# Patient Record
Sex: Female | Born: 1940 | Race: Black or African American | Hispanic: No | Smoking: Never smoker
Health system: Southern US, Community
[De-identification: ages and names within clinical notes are randomized; demographics above are authoritative.]

## PROBLEM LIST (undated history)

## (undated) DIAGNOSIS — Z8639 Personal history of other endocrine, nutritional and metabolic disease: Secondary | ICD-10-CM

## (undated) DIAGNOSIS — N1330 Unspecified hydronephrosis: Secondary | ICD-10-CM

## (undated) DIAGNOSIS — R350 Frequency of micturition: Secondary | ICD-10-CM

## (undated) DIAGNOSIS — Z923 Personal history of irradiation: Secondary | ICD-10-CM

## (undated) DIAGNOSIS — H269 Unspecified cataract: Secondary | ICD-10-CM

## (undated) DIAGNOSIS — Z860100 Personal history of colon polyps, unspecified: Secondary | ICD-10-CM

## (undated) DIAGNOSIS — Z87440 Personal history of urinary (tract) infections: Secondary | ICD-10-CM

## (undated) DIAGNOSIS — L309 Dermatitis, unspecified: Secondary | ICD-10-CM

## (undated) DIAGNOSIS — Z972 Presence of dental prosthetic device (complete) (partial): Secondary | ICD-10-CM

## (undated) HISTORY — PX: TUBAL LIGATION: SHX77

## (undated) HISTORY — PX: DILATION AND CURETTAGE OF UTERUS: SHX78

---
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
## 2000-04-13 ENCOUNTER — Other Ambulatory Visit: Admission: RE | Admit: 2000-04-13 | Discharge: 2000-04-13 | Payer: Self-pay | Admitting: Obstetrics and Gynecology

## 2000-04-27 ENCOUNTER — Encounter: Payer: Self-pay | Admitting: Obstetrics and Gynecology

## 2000-04-27 ENCOUNTER — Encounter: Admission: RE | Admit: 2000-04-27 | Discharge: 2000-04-27 | Payer: Self-pay | Admitting: Obstetrics and Gynecology

## 2001-04-12 ENCOUNTER — Encounter: Payer: Self-pay | Admitting: Rheumatology

## 2001-04-12 ENCOUNTER — Encounter: Admission: RE | Admit: 2001-04-12 | Discharge: 2001-04-12 | Payer: Self-pay | Admitting: Rheumatology

## 2001-05-03 ENCOUNTER — Encounter: Admission: RE | Admit: 2001-05-03 | Discharge: 2001-05-03 | Payer: Self-pay | Admitting: Internal Medicine

## 2001-05-03 ENCOUNTER — Encounter: Payer: Self-pay | Admitting: Internal Medicine

## 2002-05-09 ENCOUNTER — Encounter: Payer: Self-pay | Admitting: Internal Medicine

## 2002-05-09 ENCOUNTER — Encounter: Admission: RE | Admit: 2002-05-09 | Discharge: 2002-05-09 | Payer: Self-pay | Admitting: Internal Medicine

## 2002-08-29 ENCOUNTER — Ambulatory Visit (HOSPITAL_COMMUNITY): Admission: RE | Admit: 2002-08-29 | Discharge: 2002-08-29 | Payer: Self-pay | Admitting: Gastroenterology

## 2003-07-03 ENCOUNTER — Other Ambulatory Visit: Admission: RE | Admit: 2003-07-03 | Discharge: 2003-07-03 | Payer: Self-pay | Admitting: Internal Medicine

## 2003-07-24 ENCOUNTER — Encounter: Admission: RE | Admit: 2003-07-24 | Discharge: 2003-07-24 | Payer: Self-pay | Admitting: Internal Medicine

## 2004-07-29 ENCOUNTER — Encounter: Admission: RE | Admit: 2004-07-29 | Discharge: 2004-07-29 | Payer: Self-pay | Admitting: Internal Medicine

## 2004-09-23 ENCOUNTER — Other Ambulatory Visit: Admission: RE | Admit: 2004-09-23 | Discharge: 2004-09-23 | Payer: Self-pay | Admitting: Internal Medicine

## 2005-08-04 ENCOUNTER — Encounter: Admission: RE | Admit: 2005-08-04 | Discharge: 2005-08-04 | Payer: Self-pay | Admitting: Internal Medicine

## 2005-10-13 ENCOUNTER — Other Ambulatory Visit: Admission: RE | Admit: 2005-10-13 | Discharge: 2005-10-13 | Payer: Self-pay | Admitting: Internal Medicine

## 2006-08-10 ENCOUNTER — Encounter: Admission: RE | Admit: 2006-08-10 | Discharge: 2006-08-10 | Payer: Self-pay | Admitting: Internal Medicine

## 2006-08-24 ENCOUNTER — Encounter: Admission: RE | Admit: 2006-08-24 | Discharge: 2006-08-24 | Payer: Self-pay | Admitting: Internal Medicine

## 2006-11-16 ENCOUNTER — Other Ambulatory Visit: Admission: RE | Admit: 2006-11-16 | Discharge: 2006-11-16 | Payer: Self-pay | Admitting: Internal Medicine

## 2007-08-16 ENCOUNTER — Encounter: Admission: RE | Admit: 2007-08-16 | Discharge: 2007-08-16 | Payer: Self-pay | Admitting: Internal Medicine

## 2009-07-30 ENCOUNTER — Other Ambulatory Visit: Admission: RE | Admit: 2009-07-30 | Discharge: 2009-07-30 | Payer: Self-pay | Admitting: Geriatric Medicine

## 2009-08-20 ENCOUNTER — Encounter: Admission: RE | Admit: 2009-08-20 | Discharge: 2009-08-20 | Payer: Self-pay | Admitting: Internal Medicine

## 2010-01-27 ENCOUNTER — Encounter: Payer: Self-pay | Admitting: Internal Medicine

## 2010-05-24 NOTE — Op Note (Signed)
   NAME:  MATRACA, HUNKINS NO.:  [id number]   MEDICAL RECORD NO.:  [id number]                   PATIENT TYPE:  AMB   LOCATION:  ENDO                                 FACILITY:  [facility]   PHYSICIAN:  Danise Edge, M.D.                DATE OF BIRTH:  Oct 14, 1940   DATE OF PROCEDURE:  08/29/2002  DATE OF DISCHARGE:                                 OPERATIVE REPORT   PROCEDURE:  Screening colonoscopy.   INDICATIONS FOR PROCEDURE:  Ms. Xandrea Clarey is a 70 year old female born  06/06/1940. Ms. Celia is scheduled to undergo her first screening  colonoscopy with polypectomy to prevent colon cancer.   ENDOSCOPIST:  Charolett Bumpers, M.D.   PREMEDICATION:  Versed 7.5 mg, Demerol 50 mg .   DESCRIPTION OF PROCEDURE:  After obtaining informed consent, Ms. Shoaff was  placed in the left lateral decubitus position. I administered intravenous  Demerol and intravenous Versed to achieve conscious sedation for the  procedure. The patient's blood pressure, oxygen saturation and cardiac  rhythm were monitored throughout the procedure and documented in the medical  record.   Anal inspection was normal. Digital rectal exam was normal. The Olympus  pediatric adjustable colonoscope was introduced into the rectum and advanced  to the cecum. Colonic preparation for the exam today was excellent.   RECTUM:  Normal.   SIGMOID COLON AND DESCENDING COLON:  Normal.   SPLENIC FLEXURE:  Normal.   TRANSVERSE COLON:  Normal.   HEPATIC FLEXURE:  Normal.   ASCENDING COLON:  Normal.   CECUM AND ILEOCECAL VALVE:  Normal.    ASSESSMENT:  Normal screening proctocolonoscopy to the cecum. No endoscopic  evidence for the presence of colorectal neoplasia.                                               Danise Edge, M.D.    MJ/MEDQ  D:  08/29/2002  T:  08/29/2002  Job:  [id number]   cc:   Lilla Shook, M.D.  [address], [address]  [place]  [place]  [address]  Fax: [phone]

## 2010-07-31 ENCOUNTER — Other Ambulatory Visit: Payer: Self-pay | Admitting: Internal Medicine

## 2010-07-31 DIAGNOSIS — Z1231 Encounter for screening mammogram for malignant neoplasm of breast: Secondary | ICD-10-CM

## 2010-08-05 ENCOUNTER — Other Ambulatory Visit (HOSPITAL_COMMUNITY)
Admission: RE | Admit: 2010-08-05 | Discharge: 2010-08-05 | Disposition: A | Discharge status: Home / Self Care | Payer: Medicare Other | Source: Ambulatory Visit | Attending: Internal Medicine | Admitting: Internal Medicine

## 2010-08-05 ENCOUNTER — Other Ambulatory Visit: Payer: Self-pay | Admitting: *Deleted

## 2010-08-05 DIAGNOSIS — Z01419 Encounter for gynecological examination (general) (routine) without abnormal findings: Secondary | ICD-10-CM | POA: Insufficient documentation

## 2010-08-26 ENCOUNTER — Ambulatory Visit
Admission: RE | Admit: 2010-08-26 | Discharge: 2010-08-26 | Disposition: A | Discharge status: Home / Self Care | Payer: Medicare Other | Source: Ambulatory Visit | Attending: Internal Medicine | Admitting: Internal Medicine

## 2010-08-26 DIAGNOSIS — Z1231 Encounter for screening mammogram for malignant neoplasm of breast: Secondary | ICD-10-CM

## 2010-09-18 ENCOUNTER — Other Ambulatory Visit (HOSPITAL_COMMUNITY): Payer: Self-pay | Admitting: Internal Medicine

## 2010-09-18 DIAGNOSIS — E0590 Thyrotoxicosis, unspecified without thyrotoxic crisis or storm: Secondary | ICD-10-CM

## 2010-10-03 ENCOUNTER — Ambulatory Visit (HOSPITAL_COMMUNITY): Payer: Medicare Other

## 2010-10-03 ENCOUNTER — Encounter (HOSPITAL_COMMUNITY)
Admission: RE | Admit: 2010-10-03 | Discharge: 2010-10-03 | Disposition: A | Discharge status: Home / Self Care | Payer: Medicare Other | Source: Ambulatory Visit | Attending: Internal Medicine | Admitting: Internal Medicine

## 2010-10-03 DIAGNOSIS — E0590 Thyrotoxicosis, unspecified without thyrotoxic crisis or storm: Secondary | ICD-10-CM | POA: Insufficient documentation

## 2010-10-04 ENCOUNTER — Other Ambulatory Visit (HOSPITAL_COMMUNITY): Payer: Medicare Other

## 2010-10-04 ENCOUNTER — Encounter (HOSPITAL_COMMUNITY)
Admission: RE | Admit: 2010-10-04 | Discharge: 2010-10-04 | Disposition: A | Discharge status: Home / Self Care | Payer: Medicare Other | Source: Ambulatory Visit | Attending: Internal Medicine | Admitting: Internal Medicine

## 2010-10-04 DIAGNOSIS — E042 Nontoxic multinodular goiter: Secondary | ICD-10-CM | POA: Insufficient documentation

## 2010-10-04 DIAGNOSIS — E0590 Thyrotoxicosis, unspecified without thyrotoxic crisis or storm: Secondary | ICD-10-CM | POA: Insufficient documentation

## 2010-10-04 MED ORDER — SODIUM IODIDE I 131 CAPSULE
8.1000 | Freq: Once | INTRAVENOUS | Status: AC | PRN
  Administered 2010-10-03: 8.1 via ORAL

## 2010-10-04 MED ORDER — SODIUM PERTECHNETATE TC 99M INJECTION
10.2000 | Freq: Once | INTRAVENOUS | Status: AC | PRN
  Administered 2010-10-04: 10 via INTRAVENOUS

## 2010-10-15 ENCOUNTER — Ambulatory Visit (HOSPITAL_COMMUNITY): Payer: Medicare Other

## 2010-10-16 ENCOUNTER — Other Ambulatory Visit (HOSPITAL_COMMUNITY): Payer: Medicare Other

## 2010-11-01 ENCOUNTER — Other Ambulatory Visit: Payer: Self-pay | Admitting: Endocrinology

## 2010-11-01 DIAGNOSIS — E0590 Thyrotoxicosis, unspecified without thyrotoxic crisis or storm: Secondary | ICD-10-CM

## 2010-11-11 ENCOUNTER — Ambulatory Visit
Admission: RE | Admit: 2010-11-11 | Discharge: 2010-11-11 | Disposition: A | Discharge status: Home / Self Care | Payer: Medicare Other | Source: Ambulatory Visit | Attending: Endocrinology | Admitting: Endocrinology

## 2010-11-11 DIAGNOSIS — E0590 Thyrotoxicosis, unspecified without thyrotoxic crisis or storm: Secondary | ICD-10-CM

## 2010-11-26 ENCOUNTER — Other Ambulatory Visit (HOSPITAL_COMMUNITY): Payer: Self-pay | Admitting: Endocrinology

## 2010-11-26 DIAGNOSIS — E041 Nontoxic single thyroid nodule: Secondary | ICD-10-CM

## 2010-12-11 ENCOUNTER — Other Ambulatory Visit: Payer: Self-pay | Admitting: Endocrinology

## 2010-12-11 DIAGNOSIS — E042 Nontoxic multinodular goiter: Secondary | ICD-10-CM

## 2010-12-17 ENCOUNTER — Ambulatory Visit
Admission: RE | Admit: 2010-12-17 | Discharge: 2010-12-17 | Disposition: A | Discharge status: Home / Self Care | Payer: Medicare Other | Source: Ambulatory Visit | Attending: Endocrinology | Admitting: Endocrinology

## 2010-12-17 ENCOUNTER — Other Ambulatory Visit (HOSPITAL_COMMUNITY)
Admission: RE | Admit: 2010-12-17 | Discharge: 2010-12-17 | Disposition: A | Discharge status: Home / Self Care | Payer: Medicare Other | Source: Ambulatory Visit | Attending: Interventional Radiology | Admitting: Interventional Radiology

## 2010-12-17 DIAGNOSIS — E042 Nontoxic multinodular goiter: Secondary | ICD-10-CM

## 2010-12-17 DIAGNOSIS — E049 Nontoxic goiter, unspecified: Secondary | ICD-10-CM | POA: Insufficient documentation

## 2011-01-09 ENCOUNTER — Other Ambulatory Visit: Payer: Self-pay | Admitting: Endocrinology

## 2011-01-09 DIAGNOSIS — E042 Nontoxic multinodular goiter: Secondary | ICD-10-CM

## 2011-01-09 DIAGNOSIS — E0590 Thyrotoxicosis, unspecified without thyrotoxic crisis or storm: Secondary | ICD-10-CM

## 2011-01-27 ENCOUNTER — Encounter (HOSPITAL_COMMUNITY)
Admission: RE | Admit: 2011-01-27 | Discharge: 2011-01-27 | Disposition: A | Discharge status: Home / Self Care | Payer: Medicare Other | Source: Ambulatory Visit | Attending: Endocrinology | Admitting: Endocrinology

## 2011-01-27 DIAGNOSIS — E0590 Thyrotoxicosis, unspecified without thyrotoxic crisis or storm: Secondary | ICD-10-CM | POA: Insufficient documentation

## 2011-01-27 DIAGNOSIS — E042 Nontoxic multinodular goiter: Secondary | ICD-10-CM | POA: Insufficient documentation

## 2011-01-27 MED ORDER — SODIUM IODIDE I 131 CAPSULE
15.6600 | Freq: Once | INTRAVENOUS | Status: AC | PRN
  Administered 2011-01-27: 15.66 via ORAL

## 2011-05-12 ENCOUNTER — Other Ambulatory Visit: Payer: Self-pay | Admitting: Internal Medicine

## 2011-05-12 DIAGNOSIS — Z1231 Encounter for screening mammogram for malignant neoplasm of breast: Secondary | ICD-10-CM

## 2011-09-01 ENCOUNTER — Ambulatory Visit
Admission: RE | Admit: 2011-09-01 | Discharge: 2011-09-01 | Disposition: A | Discharge status: Home / Self Care | Payer: Medicare Other | Source: Ambulatory Visit | Attending: Internal Medicine | Admitting: Internal Medicine

## 2011-09-01 DIAGNOSIS — Z1231 Encounter for screening mammogram for malignant neoplasm of breast: Secondary | ICD-10-CM

## 2011-12-29 ENCOUNTER — Other Ambulatory Visit (HOSPITAL_COMMUNITY)
Admission: RE | Admit: 2011-12-29 | Discharge: 2011-12-29 | Disposition: A | Discharge status: Home / Self Care | Payer: Medicare Other | Source: Ambulatory Visit | Attending: Internal Medicine | Admitting: Internal Medicine

## 2011-12-29 ENCOUNTER — Other Ambulatory Visit: Payer: Self-pay | Admitting: Internal Medicine

## 2011-12-29 DIAGNOSIS — Z124 Encounter for screening for malignant neoplasm of cervix: Secondary | ICD-10-CM | POA: Insufficient documentation

## 2012-05-24 ENCOUNTER — Other Ambulatory Visit: Payer: Self-pay | Admitting: Gastroenterology

## 2012-06-24 ENCOUNTER — Encounter (HOSPITAL_COMMUNITY): Payer: Self-pay | Admitting: *Deleted

## 2012-07-20 ENCOUNTER — Encounter (HOSPITAL_COMMUNITY): Payer: Self-pay | Admitting: Pharmacy Technician

## 2012-07-27 ENCOUNTER — Other Ambulatory Visit: Payer: Self-pay

## 2012-07-27 DIAGNOSIS — Z1231 Encounter for screening mammogram for malignant neoplasm of breast: Secondary | ICD-10-CM

## 2012-08-10 ENCOUNTER — Ambulatory Visit (HOSPITAL_COMMUNITY): Payer: Medicare Other | Admitting: Anesthesiology

## 2012-08-10 ENCOUNTER — Encounter (HOSPITAL_COMMUNITY)
Admission: RE | Disposition: A | Discharge status: Home / Self Care | Payer: Self-pay | Source: Ambulatory Visit | Attending: Gastroenterology

## 2012-08-10 ENCOUNTER — Encounter (HOSPITAL_COMMUNITY): Payer: Self-pay | Admitting: Anesthesiology

## 2012-08-10 ENCOUNTER — Ambulatory Visit (HOSPITAL_COMMUNITY)
Admission: RE | Admit: 2012-08-10 | Discharge: 2012-08-10 | Disposition: A | Discharge status: Home / Self Care | Payer: Medicare Other | Source: Ambulatory Visit | Attending: Gastroenterology | Admitting: Gastroenterology

## 2012-08-10 DIAGNOSIS — Z1211 Encounter for screening for malignant neoplasm of colon: Secondary | ICD-10-CM | POA: Insufficient documentation

## 2012-08-10 DIAGNOSIS — K5730 Diverticulosis of large intestine without perforation or abscess without bleeding: Secondary | ICD-10-CM | POA: Insufficient documentation

## 2012-08-10 HISTORY — PX: COLONOSCOPY WITH PROPOFOL: SHX5780

## 2012-08-10 SURGERY — COLONOSCOPY WITH PROPOFOL
Anesthesia: Monitor Anesthesia Care

## 2012-08-10 MED ORDER — ONDANSETRON HCL 4 MG/2ML IJ SOLN
INTRAMUSCULAR | Status: DC | PRN
  Administered 2012-08-10 (×2): 2 mg via INTRAVENOUS

## 2012-08-10 MED ORDER — KETAMINE HCL 10 MG/ML IJ SOLN
INTRAMUSCULAR | Status: DC | PRN
  Administered 2012-08-10: 15 mg via INTRAVENOUS

## 2012-08-10 MED ORDER — SODIUM CHLORIDE 0.9 % IV SOLN: INTRAVENOUS | Status: DC

## 2012-08-10 MED ORDER — LACTATED RINGERS IV SOLN
INTRAVENOUS | Status: DC | PRN
  Administered 2012-08-10: 07:00:00 via INTRAVENOUS

## 2012-08-10 MED ORDER — MIDAZOLAM HCL 5 MG/5ML IJ SOLN
INTRAMUSCULAR | Status: DC | PRN
  Administered 2012-08-10: 0.5 mg via INTRAVENOUS

## 2012-08-10 MED ORDER — FENTANYL CITRATE 0.05 MG/ML IJ SOLN
INTRAMUSCULAR | Status: DC | PRN
  Administered 2012-08-10: 50 ug via INTRAVENOUS

## 2012-08-10 MED ORDER — PROPOFOL INFUSION 10 MG/ML OPTIME
INTRAVENOUS | Status: DC | PRN
  Administered 2012-08-10: 100 ug/kg/min via INTRAVENOUS

## 2012-08-10 MED ORDER — LACTATED RINGERS IV SOLN
INTRAVENOUS | Status: DC
  Administered 2012-08-10: 1000 mL via INTRAVENOUS

## 2012-08-10 SURGICAL SUPPLY — 21 items

## 2012-08-10 NOTE — Transfer of Care (Signed)
Immediate Anesthesia Transfer of Care Note  Patient: Sandra [name]  Procedure(s) Performed: Procedure(s): COLONOSCOPY WITH PROPOFOL (N/A)  Patient Location: PACU  Anesthesia Type:MAC  Level of Consciousness: awake, alert , oriented, sedated and patient cooperative  Airway & Oxygen Therapy: Patient Spontanous Breathing and Patient connected to nasal cannula oxygen  Post-op Assessment: Report given to PACU RN and Post -op Vital signs reviewed and stable  Post vital signs: stable  Complications: No apparent anesthesia complications

## 2012-08-10 NOTE — Op Note (Signed)
Procedure: Screening colonoscopy  Endoscopist: Laural Benes  Premedication: Propofol administered by anesthesia  Procedure: The patient was placed in the left lateral decubitus position. Anal inspection and digital rectal exam were normal. The Pentax pediatric colonoscope was introduced into the rectum and advanced to the cecum. A normal-appearing appendiceal orifice and ileocecal valve were identified. Colonic preparation for the exam today was good.  Rectum. Normal. Retroflexed view of the distal rectum was normal.  Sigmoid colon and descending colon. Colonic diverticulosis.  Splenic flexure. Normal.  Transverse colon. Normal.  Hepatic flexure. Normal.  Ascending colon. Normal.  Cecum and ileocecal valve. Normal.  Assessment: Normal screening proctocolonoscopy to the cecum.

## 2012-08-10 NOTE — H&P (Signed)
  Procedure: Repeat screening colonoscopy  History: The patient is a 72 year old female born 05-12-1940. The patient underwent a screening colonoscopy on 08/29/2002. She is scheduled to undergo a repeat screening colonoscopy today.  Past medical history: Multinodular goiter associated with hyperthyroidism. Radioactive iodine ablation of the multinodular goiter. Osteopenia. Tubal ligation. Hemorrhoidectomy. Cesarean section.  Habits: The patient has never smoked cigarettes and she does not consume alcohol   Exam: The patient is alert and lying comfortably on the endoscopy stretcher. Abdomen is soft and nontender to palpation. Cardiac exam reveals a regular rhythm. Lungs are clear to auscultation.  Plan: Proceed with screening colonoscopy

## 2012-08-10 NOTE — Anesthesia Preprocedure Evaluation (Signed)
Anesthesia Evaluation  Patient identified by MRN, date of birth, ID band Patient awake    Reviewed: Allergy & Precautions, H&P , NPO status , Patient's Chart, lab work & pertinent test results  Airway Mallampati: II TM Distance: >3 FB Neck ROM: Full    Dental no notable dental hx.    Pulmonary neg pulmonary ROS,  breath sounds clear to auscultation  Pulmonary exam normal       Cardiovascular negative cardio ROS  Rhythm:Regular Rate:Normal     Neuro/Psych negative neurological ROS  negative psych ROS   GI/Hepatic negative GI ROS, Neg liver ROS,   Endo/Other  negative endocrine ROS  Renal/GU negative Renal ROS  negative genitourinary   Musculoskeletal negative musculoskeletal ROS (+)   Abdominal   Peds negative pediatric ROS (+)  Hematology negative hematology ROS (+)   Anesthesia Other Findings   Reproductive/Obstetrics negative OB ROS                           Anesthesia Physical Anesthesia Plan  ASA: I  Anesthesia Plan: MAC   Post-op Pain Management:    Induction: Intravenous  Airway Management Planned:   Additional Equipment:   Intra-op Plan:   Post-operative Plan:   Informed Consent: I have reviewed the patients History and Physical, chart, labs and discussed the procedure including the risks, benefits and alternatives for the proposed anesthesia with the patient or authorized representative who has indicated his/her understanding and acceptance.   Dental advisory given  Plan Discussed with: CRNA  Anesthesia Plan Comments:         Anesthesia Quick Evaluation  

## 2012-08-10 NOTE — Anesthesia Postprocedure Evaluation (Signed)
  Anesthesia Post-op Note  Patient: Sandra [name]  Procedure(s) Performed: Procedure(s) (LRB): COLONOSCOPY WITH PROPOFOL (N/A)  Patient Location: PACU  Anesthesia Type: MAC  Level of Consciousness: awake and alert   Airway and Oxygen Therapy: Patient Spontanous Breathing  Post-op Pain: mild  Post-op Assessment: Post-op Vital signs reviewed, Patient's Cardiovascular Status Stable, Respiratory Function Stable, Patent Airway and No signs of Nausea or vomiting  Last Vitals:  Filed Vitals:   08/10/12 0900  BP: 151/87  Temp:   Resp: 21    Post-op Vital Signs: stable   Complications: No apparent anesthesia complications

## 2012-08-11 ENCOUNTER — Encounter (HOSPITAL_COMMUNITY): Payer: Self-pay | Admitting: Gastroenterology

## 2012-09-13 ENCOUNTER — Ambulatory Visit
Admission: RE | Admit: 2012-09-13 | Discharge: 2012-09-13 | Disposition: A | Discharge status: Home / Self Care | Payer: Medicare Other | Source: Ambulatory Visit

## 2012-09-13 DIAGNOSIS — Z1231 Encounter for screening mammogram for malignant neoplasm of breast: Secondary | ICD-10-CM

## 2013-08-08 ENCOUNTER — Other Ambulatory Visit: Payer: Self-pay

## 2013-08-08 DIAGNOSIS — Z1231 Encounter for screening mammogram for malignant neoplasm of breast: Secondary | ICD-10-CM

## 2013-09-19 ENCOUNTER — Ambulatory Visit
Admission: RE | Admit: 2013-09-19 | Discharge: 2013-09-19 | Disposition: A | Discharge status: Home / Self Care | Payer: Medicare Other | Source: Ambulatory Visit

## 2013-09-19 DIAGNOSIS — Z1231 Encounter for screening mammogram for malignant neoplasm of breast: Secondary | ICD-10-CM

## 2014-08-22 ENCOUNTER — Other Ambulatory Visit: Payer: Self-pay

## 2014-08-22 DIAGNOSIS — Z1231 Encounter for screening mammogram for malignant neoplasm of breast: Secondary | ICD-10-CM

## 2014-10-02 ENCOUNTER — Ambulatory Visit
Admission: RE | Admit: 2014-10-02 | Discharge: 2014-10-02 | Disposition: A | Discharge status: Home / Self Care | Payer: Medicare Other | Source: Ambulatory Visit

## 2014-10-02 DIAGNOSIS — Z1231 Encounter for screening mammogram for malignant neoplasm of breast: Secondary | ICD-10-CM

## 2015-02-06 ENCOUNTER — Other Ambulatory Visit: Payer: Self-pay | Admitting: Urology

## 2015-02-09 ENCOUNTER — Encounter (HOSPITAL_BASED_OUTPATIENT_CLINIC_OR_DEPARTMENT_OTHER): Payer: Self-pay | Admitting: *Deleted

## 2015-02-09 NOTE — Progress Notes (Signed)
NPO AFTER MN WITH EXCEPTION CLEAR LIQUIDS UNTIL 0830 (NO CREAM/ MILK PRODUCTS).  ARRIVE AT 1300. NEEDS HG. 

## 2015-02-19 ENCOUNTER — Ambulatory Visit (HOSPITAL_BASED_OUTPATIENT_CLINIC_OR_DEPARTMENT_OTHER): Payer: Medicare Other | Admitting: Anesthesiology

## 2015-02-19 ENCOUNTER — Encounter (HOSPITAL_BASED_OUTPATIENT_CLINIC_OR_DEPARTMENT_OTHER)
Admission: RE | Disposition: A | Discharge status: Home / Self Care | Payer: Self-pay | Source: Ambulatory Visit | Attending: Urology

## 2015-02-19 ENCOUNTER — Encounter (HOSPITAL_BASED_OUTPATIENT_CLINIC_OR_DEPARTMENT_OTHER): Payer: Self-pay | Admitting: Anesthesiology

## 2015-02-19 ENCOUNTER — Ambulatory Visit (HOSPITAL_BASED_OUTPATIENT_CLINIC_OR_DEPARTMENT_OTHER)
Admission: RE | Admit: 2015-02-19 | Discharge: 2015-02-19 | Disposition: A | Discharge status: Home / Self Care | Payer: Medicare Other | Source: Ambulatory Visit | Attending: Urology | Admitting: Urology

## 2015-02-19 DIAGNOSIS — Z79899 Other long term (current) drug therapy: Secondary | ICD-10-CM | POA: Insufficient documentation

## 2015-02-19 DIAGNOSIS — N1330 Unspecified hydronephrosis: Secondary | ICD-10-CM | POA: Diagnosis present

## 2015-02-19 HISTORY — DX: Unspecified hydronephrosis: N13.30

## 2015-02-19 HISTORY — PX: CYSTOSCOPY WITH RETROGRADE PYELOGRAM, URETEROSCOPY AND STENT PLACEMENT: SHX5789

## 2015-02-19 HISTORY — DX: Frequency of micturition: R35.0

## 2015-02-19 HISTORY — DX: Presence of dental prosthetic device (complete) (partial): Z97.2

## 2015-02-19 HISTORY — DX: Unspecified cataract: H26.9

## 2015-02-19 HISTORY — DX: Personal history of colonic polyps: Z86.010

## 2015-02-19 HISTORY — DX: Personal history of colon polyps, unspecified: Z86.0100

## 2015-02-19 HISTORY — DX: Personal history of other endocrine, nutritional and metabolic disease: Z86.39

## 2015-02-19 HISTORY — DX: Personal history of irradiation: Z92.3

## 2015-02-19 HISTORY — DX: Dermatitis, unspecified: L30.9

## 2015-02-19 LAB — POCT HEMOGLOBIN-HEMACUE: Hemoglobin: 11.9 g/dL — ABNORMAL LOW (ref 12.0–15.0)

## 2015-02-19 SURGERY — CYSTOURETEROSCOPY, WITH RETROGRADE PYELOGRAM AND STENT INSERTION
Anesthesia: General | Site: Ureter | Laterality: Right

## 2015-02-19 MED ORDER — LACTATED RINGERS IV SOLN
INTRAVENOUS | Status: DC
  Administered 2015-02-19 (×2): via INTRAVENOUS
  Filled 2015-02-19: qty 1000

## 2015-02-19 MED ORDER — FENTANYL CITRATE (PF) 100 MCG/2ML IJ SOLN
25.0000 ug | INTRAMUSCULAR | Status: DC | PRN
  Filled 2015-02-19: qty 1

## 2015-02-19 MED ORDER — DEXAMETHASONE SODIUM PHOSPHATE 10 MG/ML IJ SOLN
INTRAMUSCULAR | Status: DC | PRN
  Administered 2015-02-19: 10 mg via INTRAVENOUS

## 2015-02-19 MED ORDER — DEXAMETHASONE SODIUM PHOSPHATE 10 MG/ML IJ SOLN
INTRAMUSCULAR | Status: AC
  Filled 2015-02-19: qty 1

## 2015-02-19 MED ORDER — CEFAZOLIN SODIUM-DEXTROSE 2-3 GM-% IV SOLR
2.0000 g | INTRAVENOUS | Status: AC
  Administered 2015-02-19: 2 g via INTRAVENOUS
  Filled 2015-02-19: qty 50

## 2015-02-19 MED ORDER — PROPOFOL 10 MG/ML IV BOLUS
INTRAVENOUS | Status: DC | PRN
  Administered 2015-02-19: 160 mg via INTRAVENOUS

## 2015-02-19 MED ORDER — ONDANSETRON HCL 4 MG/2ML IJ SOLN
INTRAMUSCULAR | Status: DC | PRN
  Administered 2015-02-19: 4 mg via INTRAVENOUS

## 2015-02-19 MED ORDER — LIDOCAINE HCL (CARDIAC) 20 MG/ML IV SOLN
INTRAVENOUS | Status: AC
  Filled 2015-02-19: qty 5

## 2015-02-19 MED ORDER — FENTANYL CITRATE (PF) 100 MCG/2ML IJ SOLN
INTRAMUSCULAR | Status: AC
  Filled 2015-02-19: qty 2

## 2015-02-19 MED ORDER — CEFAZOLIN SODIUM 1-5 GM-% IV SOLN
1.0000 g | INTRAVENOUS | Status: DC
  Filled 2015-02-19: qty 50

## 2015-02-19 MED ORDER — LACTATED RINGERS IV SOLN
INTRAVENOUS | Status: DC
  Filled 2015-02-19: qty 1000

## 2015-02-19 MED ORDER — IOHEXOL 350 MG/ML SOLN
INTRAVENOUS | Status: DC | PRN
Start: 2015-02-19 — End: 2015-02-19
  Administered 2015-02-19: 4 mL

## 2015-02-19 MED ORDER — CEFAZOLIN SODIUM-DEXTROSE 2-3 GM-% IV SOLR
INTRAVENOUS | Status: AC
  Filled 2015-02-19: qty 50

## 2015-02-19 MED ORDER — STERILE WATER FOR IRRIGATION IR SOLN
Status: DC | PRN
  Administered 2015-02-19: 500 mL

## 2015-02-19 MED ORDER — ONDANSETRON HCL 4 MG/2ML IJ SOLN
INTRAMUSCULAR | Status: AC
  Filled 2015-02-19: qty 2

## 2015-02-19 MED ORDER — LIDOCAINE HCL (CARDIAC) 20 MG/ML IV SOLN
INTRAVENOUS | Status: DC | PRN
  Administered 2015-02-19: 80 mg via INTRAVENOUS

## 2015-02-19 MED ORDER — TRAMADOL HCL 50 MG PO TABS: 50.0000 mg | ORAL_TABLET | Freq: Four times a day (QID) | ORAL | Status: AC | PRN

## 2015-02-19 MED ORDER — PROPOFOL 10 MG/ML IV BOLUS
INTRAVENOUS | Status: AC
  Filled 2015-02-19: qty 20

## 2015-02-19 MED ORDER — KETOROLAC TROMETHAMINE 30 MG/ML IJ SOLN
INTRAMUSCULAR | Status: DC | PRN
  Administered 2015-02-19: 30 mg via INTRAVENOUS

## 2015-02-19 MED ORDER — SODIUM CHLORIDE 0.9 % IR SOLN
Status: DC | PRN
  Administered 2015-02-19: 3000 mL
  Administered 2015-02-19: 1000 mL

## 2015-02-19 MED ORDER — FENTANYL CITRATE (PF) 100 MCG/2ML IJ SOLN
INTRAMUSCULAR | Status: DC | PRN
  Administered 2015-02-19 (×4): 25 ug via INTRAVENOUS

## 2015-02-19 SURGICAL SUPPLY — 25 items
BAG URO CATCHER STRL LF (MISCELLANEOUS) ×3 IMPLANT
BASKET DAKOTA 1.9FR 11X120 (BASKET) IMPLANT
BASKET LASER NITINOL 1.9FR (BASKET) IMPLANT
BASKET ZERO TIP NITINOL 2.4FR (BASKET) IMPLANT
CANISTER SUCT LVC 12 LTR MEDI- (MISCELLANEOUS) IMPLANT
CATH INTERMIT  6FR 70CM (CATHETERS) ×3 IMPLANT
CLOTH BEACON ORANGE TIMEOUT ST (SAFETY) ×3 IMPLANT
FIBER LASER FLEXIVA 365 (UROLOGICAL SUPPLIES) IMPLANT
FIBER LASER TRAC TIP (UROLOGICAL SUPPLIES) IMPLANT
GLOVE BIO SURGEON STRL SZ8 (GLOVE) ×3 IMPLANT
GOWN STRL REUS W/ TWL LRG LVL3 (GOWN DISPOSABLE) ×2 IMPLANT
GOWN STRL REUS W/ TWL XL LVL3 (GOWN DISPOSABLE) ×2 IMPLANT
GOWN STRL REUS W/TWL LRG LVL3 (GOWN DISPOSABLE) ×1
GOWN STRL REUS W/TWL XL LVL3 (GOWN DISPOSABLE) ×1
GUIDEWIRE ANG ZIPWIRE 038X150 (WIRE) ×3 IMPLANT
GUIDEWIRE STR DUAL SENSOR (WIRE) IMPLANT
IV NS IRRIG 3000ML ARTHROMATIC (IV SOLUTION) ×3 IMPLANT
KIT ROOM TURNOVER WOR (KITS) ×3 IMPLANT
MANIFOLD NEPTUNE II (INSTRUMENTS) IMPLANT
PACK CYSTO (CUSTOM PROCEDURE TRAY) ×3 IMPLANT
PACKING VAGINAL (PACKING) ×3 IMPLANT
STENT URET 6FRX26 CONTOUR (STENTS) IMPLANT
SYRINGE 10CC LL (SYRINGE) ×3 IMPLANT
TUBE CONNECTING 12X1/4 (SUCTIONS) IMPLANT
TUBE FEEDING 8FR 16IN STR KANG (MISCELLANEOUS) IMPLANT

## 2015-02-19 NOTE — Anesthesia Preprocedure Evaluation (Addendum)
Anesthesia Evaluation  Patient identified by MRN, date of birth, ID band Patient awake    Reviewed: Allergy & Precautions, NPO status , Patient's Chart, lab work & pertinent test results  Airway Mallampati: II       Dental  (+) Partial Upper, Dental Advisory Given   Pulmonary neg pulmonary ROS,    breath sounds clear to auscultation       Cardiovascular negative cardio ROS   Rhythm:Regular Rate:Normal     Neuro/Psych negative neurological ROS  negative psych ROS   GI/Hepatic negative GI ROS, Neg liver ROS,   Endo/Other  negative endocrine ROSHyperthyroidism   Renal/GU Renal disease  negative genitourinary   Musculoskeletal negative musculoskeletal ROS (+)   Abdominal   Peds negative pediatric ROS (+)  Hematology negative hematology ROS (+)   Anesthesia Other Findings   Reproductive/Obstetrics negative OB ROS                            Anesthesia Physical Anesthesia Plan  ASA: II  Anesthesia Plan: General   Post-op Pain Management:    Induction: Intravenous  Airway Management Planned: LMA  Additional Equipment:   Intra-op Plan:   Post-operative Plan: Extubation in OR  Informed Consent: I have reviewed the patients History and Physical, chart, labs and discussed the procedure including the risks, benefits and alternatives for the proposed anesthesia with the patient or authorized representative who has indicated his/her understanding and acceptance.   Dental advisory given  Plan Discussed with: CRNA  Anesthesia Plan Comments:         Anesthesia Quick Evaluation

## 2015-02-19 NOTE — Transfer of Care (Signed)
Immediate Anesthesia Transfer of Care Note  Patient: Sandra [name]  Procedure(s) Performed: Procedure(s) (LRB): CYSTOSCOPY WITH RETROGRADE PYELOGRAM, URETEROSCOPY (Right)  Patient Location: PACU  Anesthesia Type: General  Level of Consciousness: awake, sedated, patient cooperative and responds to stimulation  Airway & Oxygen Therapy: Patient Spontanous Breathing and Patient connected to face mask oxygen  Post-op Assessment: Report given to PACU RN, Post -op Vital signs reviewed and stable and Patient moving all extremities  Post vital signs: Reviewed and stable  Complications: No apparent anesthesia complications

## 2015-02-19 NOTE — H&P (Signed)
Urology Admission H&P  Chief Complaint: urinary frequency  History of Present Illness: Sandra [name] is a 75yo with a hx of pelvic organ prolapse who on CT scan was found to the right moderate hydronephrosis to the mid ureter. No definiative stone was seen. She denies any flank pain. She has mild urinary frequency and urgency.  Past Medical History  Diagnosis Date  . Hydronephrosis, right   . History of colon polyps   . Cataract of both eyes     extraction surgery scheduled for 04/ 2017  . H/O radioactive iodine thyroid ablation     2012  . History of hyperthyroidism     dx 2012-- monitored by dr Legrand [place] altheimer  . Frequency of urination   . Eczema   . Wears dentures     UPPER   Past Surgical History  Procedure Laterality Date  . Hemorrhoid surgery  1950's  . Colonoscopy with propofol N/A 08/10/2012    Procedure: COLONOSCOPY WITH PROPOFOL;  Surgeon: Garlan Fair, MD;  Location: [facility];  Service: Endoscopy;  Laterality: N/A;    Home Medications:  Prescriptions prior to admission  Medication Sig Dispense Refill Last Dose  . Calcium Carbonate-Vitamin D (CALTRATE 600+D PO) Take 1 tablet by mouth 2 (two) times daily.   02/18/2015 at Unknown time  . Cholecalciferol (VITAMIN D) 2000 UNITS tablet Take 2,000 Units by mouth daily.   02/18/2015 at Unknown time  . hydrocortisone cream 1 % Apply 1 application topically as needed for itching.   02/19/2015 at 0530   Allergies: No Known Allergies  History reviewed. No pertinent family history. Social History:  reports that she has never smoked. She has never used smokeless tobacco. She reports that she does not drink alcohol or use illicit drugs.  Review of Systems  All other systems reviewed and are negative.   Physical Exam:  Vital signs in last 24 hours: Temp:  [97.9 F (36.6 C)] 97.9 F (36.6 C) (02/13 1322) Pulse Rate:  [72] 72 (02/13 1322) Resp:  [14] 14 (02/13 1322) BP: (152)/(85) 152/85 mmHg (02/13 1322) SpO2:  [100 %]  100 % (02/13 1322) Weight:  [82.101 kg (181 lb)] 82.101 kg (181 lb) (02/13 1322) Physical Exam  Constitutional: She is oriented to person, place, and time. She appears well-developed and well-nourished.  HENT:  Head: Normocephalic and atraumatic.  Eyes: EOM are normal. Pupils are equal, round, and reactive to light.  Neck: Normal range of motion. No thyromegaly present.  Cardiovascular: Normal rate and regular rhythm.   Respiratory: Effort normal. No respiratory distress.  GI: Soft. She exhibits no distension and no mass. There is no tenderness. There is no rebound and no guarding.  Musculoskeletal: Normal range of motion.  Neurological: She is alert and oriented to person, place, and time.  Skin: Skin is warm and dry.  Psychiatric: She has a normal mood and affect. Her behavior is normal. Judgment and thought content normal.    Laboratory Data:  Results for orders placed or performed during the hospital encounter of 02/19/15 (from the past 24 hour(s))  Hemoglobin-hemacue, POC     Status: Abnormal   Collection Time: 02/19/15  1:57 PM  Result Value Ref Range   Hemoglobin 11.9 (L) 12.0 - 15.0 g/dL   No results found for this or any previous visit (from the past 240 hour(s)). Creatinine: No results for input(s): CREATININE in the last 168 hours. Baseline Creatinine: unknown  Impression/Assessment:  74yo with right hydronephrosis  Plan:  The risks/benefits/alternatives to  cystoscopy, right retrograde, right ureteroscopy, possible stone extraction possible ureteral biopsy possible stent placement was explained to the patient and she understands and wishes to proceed with surgery  Alaa Mullally L 02/19/2015, 2:29 PM

## 2015-02-19 NOTE — Discharge Instructions (Signed)
Ureteroscopy, Care After Refer to this sheet in the next few weeks. These instructions provide you with information on caring for yourself after your procedure. Your health care provider may also give you more specific instructions. Your treatment has been planned according to current medical practices, but problems sometimes occur. Call your health care provider if you have any problems or questions after your procedure.  WHAT TO EXPECT AFTER THE PROCEDURE  After your procedure, it is typical to have the following:   A burning sensation when you urinate.  Blood in your urine. HOME CARE INSTRUCTIONS   Only take medicines as directed by your health care provider. Do not take any over-the-counter pain medication unless your health care provider says it is okay.  Take a warm bath or hold a warm washcloth over your groin to relieve burning.  Drink enough fluids to keep your urine clear or pale yellow.  Drink two 8-ounce glasses of water every hour for the first 2 hours after you get home.  Continue to drink water often at home.  You can eat what you usually do.  Ask your surgeon when you can do your usual activities.  If you had a tube placed to keep urine flowing (ureteral stent), ask your health care provider when you need to return to have it removed.  Keep all follow-up appointments. SEEK MEDICAL CARE IF:   You have chills or fever.  You have burning pain for longer than 24 hours after the procedure.  You have blood in your urine for longer than 24 hours after the procedure. SEEK IMMEDIATE MEDICAL CARE IF:   You have large amounts of blood or clots in your urine.  You have very bad pain.  You have chest pain or trouble breathing.   This information is not intended to replace advice given to you by your health care provider. Make sure you discuss any questions you have with your health care provider.   Document Released: 12/28/2012 Document Reviewed: 12/28/2012 Elsevier  Interactive Patient Education 2016 [place] Anest[facility] Home Care Instructions  Activity: Get plenty of rest for the remainder of the day. A responsible adult should stay with you for 24 hours following the procedure.  For the next 24 hours, DO NOT: -Drive a car -Paediatric nurse -Drink alcoholic beverages -Take any medication unless instructed by your physician -Make any legal decisions or sign important papers.  Meals: Start with liquid foods such as gelatin or soup. Progress to regular foods as tolerated. Avoid greasy, spicy, heavy foods. If nausea and/or vomiting occur, drink only clear liquids until the nausea and/or vomiting subsides. Call your physician if vomiting continues.  Special Instructions/Symptoms: Your throat may feel dry or sore from the anesthesia or the breathing tube placed in your throat during surgery. If this causes discomfort, gargle with warm salt water. The discomfort should disappear within 24 hours.  If you had a scopolamine patch placed behind your ear for the management of post- operative nausea and/or vomiting:  1. The medication in the patch is effective for 72 hours, after which it should be removed.  Wrap patch in a tissue and discard in the trash. Wash hands thoroughly with soap and water. 2. You may remove the patch earlier than 72 hours if you experience unpleasant side effects which may include dry mouth, dizziness or visual disturbances. 3. Avoid touching the patch. Wash your hands with soap and water after contact with the patch.

## 2015-02-19 NOTE — Op Note (Signed)
Preoperative diagnosis: Right hydronephrosis  Postoperative diagnosis: Same  Procedure: 1 cystoscopy 2.  right retrograde pyelography 3.  Intraoperative fluoroscopy, under one hour, with interpretation 4.  Right diagnostic ureteroscopy  Attending: Rosie Fate  Anesthesia: General  Estimated blood loss: None  Drains: none  Specimens: none  Antibiotics: ancef  Findings: tortuous proximal and mid ureter. No stones, no tumors seen in the ureter. Moderate hydroureteronephrosis to the level of the UVJ.  Indications: Patient is a 75 year old female with a history of hydronephrosis.  After discussing treatment options, she decided proceed with right diagnostic ureteroscopy  Procedure her in detail: The patient was brought to the operating room and a brief timeout was done to ensure correct patient, correct procedure, correct site.  General anesthesia was administered patient was placed in dorsal lithotomy position.  Her genitalia was then prepped and draped in usual sterile fashion.  A rigid 55 French cystoscope was passed in the urethra and the bladder.  Bladder was inspected free masses or lesions.  the ureteral orifices were in the normal orthotopic locations.  a 6 french ureteral catheter was then instilled into the right ureter orifice.  a gentle retrograde was obtained and findings noted above.  we then placed a zip wire through the ureteral catheter and advanced up to the renal pelvis.  we then removed the cystoscope and cannulated the right ureteral orifice with a semirigid ureteroscope.  we then performed ureteroscopy up to the level of the UPJ. No stone or tumor was encountered.  the bladder was then drained and this concluded the procedure which was well tolerated by patient.  Complications: None  Condition: Stable, extubated, transferred to PACU  Plan: Pt is to followup in 2 weeks

## 2015-02-19 NOTE — Brief Op Note (Signed)
02/19/2015  2:59 PM  PATIENT:  Sandra [name]  75 y.o. female  PRE-OPERATIVE DIAGNOSIS:  RIGHT HYDRONEPHROSIS  POST-OPERATIVE DIAGNOSIS:  RIGHT HYDRONEPHROSIS  PROCEDURE:  Procedure(s): CYSTOSCOPY WITH RETROGRADE PYELOGRAM, URETEROSCOPY (Right)  SURGEON:  Surgeon(s) and Role:    * Cleon Gustin, MD - Primary  PHYSICIAN ASSISTANT:   ASSISTANTS: none   ANESTHESIA:   general  EBL:  Total I/O In: 1000 [I.V.:1000] Out: -   BLOOD ADMINISTERED:none  DRAINS: none   LOCAL MEDICATIONS USED:  NONE  SPECIMEN:  No Specimen  DISPOSITION OF SPECIMEN:  N/A  COUNTS:  YES  TOURNIQUET:  * No tourniquets in log *  DICTATION: .Note written in EPIC  PLAN OF CARE: Discharge to home after PACU  PATIENT DISPOSITION:  PACU - hemodynamically stable.   Delay start of Pharmacological VTE agent (>24hrs) due to surgical blood loss or risk of bleeding: not applicable

## 2015-02-19 NOTE — Anesthesia Postprocedure Evaluation (Signed)
Anesthesia Post Note  Patient: Sandra [name]  Procedure(s) Performed: Procedure(s) (LRB): CYSTOSCOPY WITH RETROGRADE PYELOGRAM, URETEROSCOPY (Right)  Patient location during evaluation: PACU Anesthesia Type: General Level of consciousness: awake and alert and oriented Pain management: pain level controlled Vital Signs Assessment: post-procedure vital signs reviewed and stable Respiratory status: spontaneous breathing, nonlabored ventilation and respiratory function stable Cardiovascular status: blood pressure returned to baseline and stable Postop Assessment: no signs of nausea or vomiting Anesthetic complications: no    Last Vitals:  Filed Vitals:   02/19/15 1536 02/19/15 1550  BP:  157/77  Pulse: 69 74  Temp:    Resp: 23 20    Last Pain: There were no vitals filed for this visit.               Hazim Treadway A.

## 2015-02-19 NOTE — Anesthesia Procedure Notes (Signed)
Procedure Name: LMA Insertion Date/Time: 02/19/2015 2:38 PM Performed by: Justice Rocher Pre-anesthesia Checklist: Patient identified, Emergency Drugs available, Suction available and Patient being monitored Patient Re-evaluated:Patient Re-evaluated prior to inductionOxygen Delivery Method: Circle System Utilized Preoxygenation: Pre-oxygenation with 100% oxygen Intubation Type: IV induction Ventilation: Mask ventilation without difficulty LMA: LMA inserted LMA Size: 4.0 Number of attempts: 1 Airway Equipment and Method: Bite block Placement Confirmation: positive ETCO2 Tube secured with: Tape Dental Injury: Teeth and Oropharynx as per pre-operative assessment

## 2015-02-20 ENCOUNTER — Encounter (HOSPITAL_BASED_OUTPATIENT_CLINIC_OR_DEPARTMENT_OTHER): Payer: Self-pay | Admitting: Urology

## 2015-05-17 ENCOUNTER — Other Ambulatory Visit: Payer: Self-pay | Admitting: Urology

## 2015-06-07 ENCOUNTER — Other Ambulatory Visit: Payer: Self-pay | Admitting: Obstetrics and Gynecology

## 2015-06-22 NOTE — Patient Instructions (Addendum)
Sandra [name]  06/22/2015   Your procedure is scheduled on: 07/03/15  Report to [facility] Main  Entrance take [facility]  elevators to 3rd floor to  [place] at 5:30AM.  Call this number if you have problems the morning of surgery [phone]   Remember: ONLY 1 PERSON MAY GO WITH YOU TO SHORT STAY TO GET  READY MORNING OF [place].  Do not eat food or drink liquids :After Midnight.     Take these medicines the morning of surgery with A SIP OF WATER: none                                You may not have any metal on your body including hair pins and              piercings  Do not wear jewelry, make-up, lotions, powders or perfumes, deodorant             Do not wear nail polish.  Do not shave  48 hours prior to surgery.               Do not bring valuables to the hospital. [name].  Contacts, dentures or bridgework may not be worn into surgery.  Leave suitcase in the car. After surgery it may be brought to your room.    Special Instructions: FOLLOW SURGEON'S INSTRUCTION IN REGARDS TO BOWEL PREPARATION PRIOR TO SURGICAL PROCEDURE DATE           _____________________________________________________________________             [facility] - Preparing for Surgery Before surgery, you can play an important role.  Because skin is not sterile, your skin needs to be as free of germs as possible.  You can reduce the number of germs on your skin by washing with CHG (chlorahexidine gluconate) soap before surgery.  CHG is an antiseptic cleaner which kills germs and bonds with the skin to continue killing germs even after washing. Please DO NOT use if you have an allergy to CHG or antibacterial soaps.  If your skin becomes reddened/irritated stop using the CHG and inform your nurse when you arrive at Short Stay. Do not shave (including legs and underarms) for at least 48 hours prior to the first CHG shower.  You  may shave your face/neck. Please follow these instructions carefully:  1.  Shower with CHG Soap the night before surgery and the  morning of Surgery.  2.  If you choose to wash your hair, wash your hair first as usual with your  normal  shampoo.  3.  After you shampoo, rinse your hair and body thoroughly to remove the  shampoo.                           4.  Use CHG as you would any other liquid soap.  You can apply chg directly  to the skin and wash                       Gently with a scrungie or clean washcloth.  5.  Apply the CHG Soap to your body ONLY FROM THE NECK  DOWN.   Do not use on face/ open                           Wound or open sores. Avoid contact with eyes, ears mouth and genitals (private parts).                       Wash face,  Genitals (private parts) with your normal soap.             6.  Wash thoroughly, paying special attention to the area where your surgery  will be performed.  7.  Thoroughly rinse your body with warm water from the neck down.  8.  DO NOT shower/wash with your normal soap after using and rinsing off  the CHG Soap.                9.  Pat yourself dry with a clean towel.            10.  Wear clean pajamas.            11.  Place clean sheets on your bed the night of your first shower and do not  sleep with pets. Day of Surgery : Do not apply any lotions/deodorants the morning of surgery.  Please wear clean clothes to the hospital/surgery center.  FAILURE TO FOLLOW THESE INSTRUCTIONS MAY RESULT IN THE CANCELLATION OF YOUR SURGERY PATIENT SIGNATURE_________________________________  NURSE SIGNATURE__________________________________  ________________________________________________________________________  WHAT IS A BLOOD TRANSFUSION? Blood Transfusion Information  A transfusion is the replacement of blood or some of its parts. Blood is made up of multiple cells which provide different functions.  Red blood cells carry oxygen and are used for blood loss  replacement.  White blood cells fight against infection.  Platelets control bleeding.  Plasma helps clot blood.  Other blood products are available for specialized needs, such as hemophilia or other clotting disorders. BEFORE THE TRANSFUSION  Who gives blood for transfusions?   Healthy volunteers who are fully evaluated to make sure their blood is safe. This is blood bank blood. Transfusion therapy is the safest it has ever been in the practice of medicine. Before blood is taken from a donor, a complete history is taken to make sure that person has no history of diseases nor engages in risky social behavior (examples are intravenous drug use or sexual activity with multiple partners). The donor's travel history is screened to minimize risk of transmitting infections, such as malaria. The donated blood is tested for signs of infectious diseases, such as HIV and hepatitis. The blood is then tested to be sure it is compatible with you in order to minimize the chance of a transfusion reaction. If you or a relative donates blood, this is often done in anticipation of surgery and is not appropriate for emergency situations. It takes many days to process the donated blood. RISKS AND COMPLICATIONS Although transfusion therapy is very safe and saves many lives, the main dangers of transfusion include:   Getting an infectious disease.  Developing a transfusion reaction. This is an allergic reaction to something in the blood you were given. Every precaution is taken to prevent this. The decision to have a blood transfusion has been considered carefully by your caregiver before blood is given. Blood is not given unless the benefits outweigh the risks. AFTER THE TRANSFUSION  Right after receiving a blood transfusion, you will usually feel much better and more energetic.  This is especially true if your red blood cells have gotten low (anemic). The transfusion raises the level of the red blood cells which  carry oxygen, and this usually causes an energy increase.  The nurse administering the transfusion will monitor you carefully for complications. HOME CARE INSTRUCTIONS  No special instructions are needed after a transfusion. You may find your energy is better. Speak with your caregiver about any limitations on activity for underlying diseases you may have. SEEK MEDICAL CARE IF:   Your condition is not improving after your transfusion.  You develop redness or irritation at the intravenous (IV) site. SEEK IMMEDIATE MEDICAL CARE IF:  Any of the following symptoms occur over the next 12 hours:  Shaking chills.  You have a temperature by mouth above 102 F (38.9 C), not controlled by medicine.  Chest, back, or muscle pain.  People around you feel you are not acting correctly or are confused.  Shortness of breath or difficulty breathing.  Dizziness and fainting.  You get a rash or develop hives.  You have a decrease in urine output.  Your urine turns a dark color or changes to pink, red, or brown. Any of the following symptoms occur over the next 10 days:  You have a temperature by mouth above 102 F (38.9 C), not controlled by medicine.  Shortness of breath.  Weakness after normal activity.  The white part of the eye turns yellow (jaundice).  You have a decrease in the amount of urine or are urinating less often.  Your urine turns a dark color or changes to pink, red, or brown. Document Released: 12/21/1999 Document Revised: 03/17/2011 Document Reviewed: 08/09/2007 [facility] Patient Information 2014 [place], [place].  _______________________________________________________________________

## 2015-06-25 ENCOUNTER — Encounter (HOSPITAL_COMMUNITY): Payer: Self-pay

## 2015-06-25 ENCOUNTER — Encounter (HOSPITAL_COMMUNITY)
Admission: RE | Admit: 2015-06-25 | Discharge: 2015-06-25 | Disposition: A | Discharge status: Home / Self Care | Payer: Medicare Other | Source: Ambulatory Visit | Attending: Urology | Admitting: Urology

## 2015-06-25 DIAGNOSIS — Z0183 Encounter for blood typing: Secondary | ICD-10-CM | POA: Diagnosis not present

## 2015-06-25 DIAGNOSIS — N813 Complete uterovaginal prolapse: Secondary | ICD-10-CM | POA: Diagnosis not present

## 2015-06-25 DIAGNOSIS — Z01812 Encounter for preprocedural laboratory examination: Secondary | ICD-10-CM | POA: Insufficient documentation

## 2015-06-25 HISTORY — DX: Personal history of urinary (tract) infections: Z87.440

## 2015-06-25 LAB — CBC
HEMATOCRIT: 35.9 % — AB (ref 36.0–46.0)
HEMOGLOBIN: 11.8 g/dL — AB (ref 12.0–15.0)
MCH: 29.5 pg (ref 26.0–34.0)
MCHC: 32.9 g/dL (ref 30.0–36.0)
MCV: 89.8 fL (ref 78.0–100.0)
Platelets: 222 10*3/uL (ref 150–400)
RBC: 4 MIL/uL (ref 3.87–5.11)
RDW: 14.5 % (ref 11.5–15.5)
WBC: 3.6 10*3/uL — ABNORMAL LOW (ref 4.0–10.5)

## 2015-06-25 LAB — PROTIME-INR
INR: 1.18 (ref 0.00–1.49)
PROTHROMBIN TIME: 14.7 s (ref 11.6–15.2)

## 2015-06-25 LAB — BASIC METABOLIC PANEL
ANION GAP: 5 (ref 5–15)
BUN: 18 mg/dL (ref 6–20)
CHLORIDE: 107 mmol/L (ref 101–111)
CO2: 28 mmol/L (ref 22–32)
Calcium: 9.4 mg/dL (ref 8.9–10.3)
Creatinine, Ser: 0.59 mg/dL (ref 0.44–1.00)
GFR calc Af Amer: >60 mL/min (ref >60)
GLUCOSE: 91 mg/dL (ref 65–99)
POTASSIUM: 4.1 mmol/L (ref 3.5–5.1)
Sodium: 140 mmol/L (ref 135–145)

## 2015-06-25 LAB — APTT: aPTT: 31 seconds (ref 24–37)

## 2015-06-25 LAB — ABO/RH: ABO/RH(D): O POS

## 2015-06-25 NOTE — Progress Notes (Signed)
OV note per Endocrinology per chart / Dr Altheimer 06/18/2015

## 2015-06-29 NOTE — H&P (Signed)
History of Present Illness   Sandra [name] has been extensively evaluated in the past. She was last assessed in November 2016. 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 She had complete uterine vault prolapse. With the uterus reduced, she had a grade 2 cystocele with central defect and minimal rectocele. I felt she would likely benefit from a transvaginal hysterectomy with vault suspension and cystocele repair and graft. She would be consented for a rectocele repair but probably would not need one. She had unilateral right-sided hydroureter. She had a low capacity unstable bladder with severe leakage associated with it. At 200 mL she leaked a mild amount with a Valsalva pressure of 78 cmH2O. I thought it was likely prudent to not do a sling for reasons I had discussed earlier.   Her right ureter was cleared by Dr Alyson Ingles in the operating room with ureteroscopy.      Past Medical History Problems  1. History of Hyperthyroidism (E05.90)  Surgical History Problems  1. History of Cystoscopy With Ureteroscopy Right 2. History of Hemorrhoidectomy  Current Meds 1. Caltrate 600+D TABS;  Therapy: (Recorded:27Jan2014) to Recorded 2. Clotrimazole CREA;  Therapy: (Recorded:27Jan2014) to Recorded 3. Vitamin D3 CAPS;  Therapy: (Recorded:27Jan2014) to Recorded  Allergies Medication  1. No Known Drug Allergies  Family History Problems  1. Family history of Death In The Family Father   Deceased at age 19; stroke, old age 40. Family history of Death In The Family Mother   Deceased at age 42; old age 54. Family history of Diabetes Mellitus : Sister 4. Family history of Family Health Status Number Of Children   3 sons 41. Family history of Stroke Syndrome : Father  Social History Problems  1. Denied: History of Alcohol Use (History) 2. Denied: History of Caffeine Use 3. Marital History -  Widowed 4. Never A Smoker 5. Occupation:   Hair Dresser  Results/Data  Urine [Data Includes: Last 1 Day]   [id number]  COLOR YELLOW   APPEARANCE CLEAR   SPECIFIC GRAVITY 1.010   pH 7.0   GLUCOSE NEGATIVE   BILIRUBIN NEGATIVE   KETONE NEGATIVE   BLOOD NEGATIVE   PROTEIN NEGATIVE   NITRITE NEGATIVE   LEUKOCYTE ESTERASE NEGATIVE    Assessment Assessed  1. 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 Follow-up Week x 1 Office  Follow-up with Dr. Matilde Sprang  Status: Hold For - Date of  Service  Requested for: [id number] Urge and stress incontinence  2. Gynecology Referral Referral  Referral  Status: Hold For - Appointment,Records   Requested for: [id number]  Discussion/Summary   Sandra Kattner and I discussed a transvaginal vault suspension with cystocele repair and graft and likely not a small rectocele repair. I do not think she should have a sling. Worsening or ongoing incontinence and sequelae discussed. The role of hysterectomy discussed. Mesh issues discussed.   We will consult Dr Servando Salina. She does not want watchful waiting or a pessary, and I do not blame her. Her right ureter has been cleared.   Dr Garwin Brothers called and they would like to proceed with Sandra [facility] surgery. There is no question that I talked to her about all the pros, cons, and risks with a diagram last time.   I drew her a picture and we talked about prolapse surgery in detail. Pros, cons, general surgical and anesthetic risks, and other options including behavioral therapy,  pessaries, and watchful waiting were discussed. She understands that prolapse repairs are successful in 80-85% of cases for prolapse symptoms and can recur anteriorly, posteriorly, and/or apically. She understands that in most cases I use a graft and general risks were discussed. Surgical risks were described but not limited to the discussion of injury to neighboring structures including the bowel  (with possible life-threatening sepsis and colostomy), bladder, urethra, vagina (all resulting in further surgery), and ureter (resulting in re-implantation). We talked about injury to nerves/soft tissue leading to debilitating and intractable pelvic, abdominal, and lower extremity pain syndromes and neuropathies. The risks of buttock pain, intractable dyspareunia, and vaginal narrowing and shortening with sequelae were discussed. Bleeding risks, transfusion rates, and infection were discussed. The risk of persistent, de novo, or worsening bladder and/or bowel incontinence/dysfunction was discussed. The need for CIC was described as well the usual post-operative course. The patient understands that she might not reach her treatment goal and that she might be worse following surgery.  We will proceed accordingly.   After a thorough review of the management options for the patient's condition the patient  elected to proceed with surgical therapy as noted above. We have discussed the potential benefits and risks of the procedure, side effects of the proposed treatment, the likelihood of the patient achieving the goals of the procedure, and any potential problems that might occur during the procedure or recuperation. Informed consent has been obtained.

## 2015-07-02 MED ORDER — GENTAMICIN SULFATE 40 MG/ML IJ SOLN
420.0000 mg | Freq: Once | INTRAVENOUS | Status: AC
  Administered 2015-07-03: 420 mg via INTRAVENOUS
  Filled 2015-07-02: qty 10.5

## 2015-07-03 ENCOUNTER — Encounter (HOSPITAL_COMMUNITY)
Admission: RE | Disposition: A | Discharge status: Home / Self Care | Payer: Self-pay | Source: Ambulatory Visit | Attending: Urology

## 2015-07-03 ENCOUNTER — Observation Stay (HOSPITAL_COMMUNITY)
Admission: RE | Admit: 2015-07-03 | Discharge: 2015-07-04 | Disposition: A | Discharge status: Home / Self Care | Payer: Medicare Other | Source: Ambulatory Visit | Attending: Urology | Admitting: Urology

## 2015-07-03 ENCOUNTER — Encounter (HOSPITAL_COMMUNITY): Payer: Self-pay | Admitting: *Deleted

## 2015-07-03 ENCOUNTER — Ambulatory Visit (HOSPITAL_COMMUNITY): Payer: Medicare Other | Admitting: Anesthesiology

## 2015-07-03 DIAGNOSIS — N3946 Mixed incontinence: Secondary | ICD-10-CM | POA: Diagnosis not present

## 2015-07-03 DIAGNOSIS — N813 Complete uterovaginal prolapse: Secondary | ICD-10-CM | POA: Diagnosis present

## 2015-07-03 DIAGNOSIS — N838 Other noninflammatory disorders of ovary, fallopian tube and broad ligament: Secondary | ICD-10-CM | POA: Diagnosis not present

## 2015-07-03 DIAGNOSIS — Z79899 Other long term (current) drug therapy: Secondary | ICD-10-CM | POA: Diagnosis not present

## 2015-07-03 DIAGNOSIS — D259 Leiomyoma of uterus, unspecified: Secondary | ICD-10-CM | POA: Insufficient documentation

## 2015-07-03 DIAGNOSIS — N816 Rectocele: Secondary | ICD-10-CM | POA: Insufficient documentation

## 2015-07-03 DIAGNOSIS — IMO0002 Reserved for concepts with insufficient information to code with codable children: Secondary | ICD-10-CM | POA: Diagnosis present

## 2015-07-03 HISTORY — PX: ANTERIOR AND POSTERIOR REPAIR: SHX5121

## 2015-07-03 HISTORY — PX: SALPINGOOPHORECTOMY: SHX82

## 2015-07-03 HISTORY — PX: CYSTOSCOPY: SHX5120

## 2015-07-03 HISTORY — PX: VAGINAL HYSTERECTOMY: SHX2639

## 2015-07-03 HISTORY — PX: VAGINAL PROLAPSE REPAIR: SHX830

## 2015-07-03 LAB — CBC WITH DIFFERENTIAL/PLATELET
BASOS ABS: 0 10*3/uL (ref 0.0–0.1)
BASOS PCT: 0 %
EOS ABS: 0 10*3/uL (ref 0.0–0.7)
Eosinophils Relative: 0 %
HEMATOCRIT: 36.1 % (ref 36.0–46.0)
HEMOGLOBIN: 11.9 g/dL — AB (ref 12.0–15.0)
LYMPHS PCT: 13 %
Lymphs Abs: 1.1 10*3/uL (ref 0.7–4.0)
MCH: 30.1 pg (ref 26.0–34.0)
MCHC: 33 g/dL (ref 30.0–36.0)
MCV: 91.4 fL (ref 78.0–100.0)
MONOS PCT: 1 %
Monocytes Absolute: 0.1 10*3/uL (ref 0.1–1.0)
NEUTROS PCT: 86 %
Neutro Abs: 7 10*3/uL (ref 1.7–7.7)
Platelets: 231 10*3/uL (ref 150–400)
RBC: 3.95 MIL/uL (ref 3.87–5.11)
RDW: 14.8 % (ref 11.5–15.5)
WBC: 8.2 10*3/uL (ref 4.0–10.5)

## 2015-07-03 LAB — TYPE AND SCREEN
ABO/RH(D): O POS
ANTIBODY SCREEN: NEGATIVE

## 2015-07-03 SURGERY — CYSTOSCOPY
Anesthesia: General

## 2015-07-03 MED ORDER — ROCURONIUM BROMIDE 100 MG/10ML IV SOLN
INTRAVENOUS | Status: AC
  Filled 2015-07-03: qty 1

## 2015-07-03 MED ORDER — SODIUM CHLORIDE 0.9 % IJ SOLN
INTRAMUSCULAR | Status: AC
  Filled 2015-07-03: qty 10

## 2015-07-03 MED ORDER — SUGAMMADEX SODIUM 200 MG/2ML IV SOLN
INTRAVENOUS | Status: DC | PRN
  Administered 2015-07-03: 170 mg via INTRAVENOUS

## 2015-07-03 MED ORDER — MIDAZOLAM HCL 5 MG/5ML IJ SOLN
INTRAMUSCULAR | Status: DC | PRN
  Administered 2015-07-03: 2 mg via INTRAVENOUS

## 2015-07-03 MED ORDER — LIDOCAINE HCL (CARDIAC) 20 MG/ML IV SOLN
INTRAVENOUS | Status: DC | PRN
  Administered 2015-07-03: 50 mg via INTRAVENOUS

## 2015-07-03 MED ORDER — SODIUM CHLORIDE 0.9 % IR SOLN
Status: DC | PRN
  Administered 2015-07-03: 500 mL

## 2015-07-03 MED ORDER — KCL IN DEXTROSE-NACL 10-5-0.45 MEQ/L-%-% IV SOLN
INTRAVENOUS | Status: DC
  Administered 2015-07-03 – 2015-07-04 (×3): via INTRAVENOUS
  Filled 2015-07-03 (×3): qty 1000

## 2015-07-03 MED ORDER — LIDOCAINE-EPINEPHRINE (PF) 1 %-1:200000 IJ SOLN
INTRAMUSCULAR | Status: DC | PRN
  Administered 2015-07-03: 20 mL
  Administered 2015-07-03: 26 mL

## 2015-07-03 MED ORDER — LIDOCAINE HCL 2 % EX GEL
CUTANEOUS | Status: AC
  Filled 2015-07-03: qty 5

## 2015-07-03 MED ORDER — PROPOFOL 10 MG/ML IV BOLUS
INTRAVENOUS | Status: DC | PRN
  Administered 2015-07-03: 160 mg via INTRAVENOUS

## 2015-07-03 MED ORDER — POLYMYXIN B SULFATE 500000 UNITS IJ SOLR
INTRAMUSCULAR | Status: AC
  Filled 2015-07-03: qty 1

## 2015-07-03 MED ORDER — ESTRADIOL 0.1 MG/GM VA CREA
TOPICAL_CREAM | VAGINAL | Status: AC
  Filled 2015-07-03: qty 85

## 2015-07-03 MED ORDER — LIDOCAINE-EPINEPHRINE (PF) 1 %-1:200000 IJ SOLN
INTRAMUSCULAR | Status: AC
  Filled 2015-07-03: qty 30

## 2015-07-03 MED ORDER — ACETAMINOPHEN 10 MG/ML IV SOLN
INTRAVENOUS | Status: DC | PRN
  Administered 2015-07-03: 1000 mg via INTRAVENOUS

## 2015-07-03 MED ORDER — MIDAZOLAM HCL 2 MG/2ML IJ SOLN
INTRAMUSCULAR | Status: AC
Start: 2015-07-03 — End: 2015-07-03
  Filled 2015-07-03: qty 2

## 2015-07-03 MED ORDER — ONDANSETRON HCL 4 MG/2ML IJ SOLN
INTRAMUSCULAR | Status: DC | PRN
  Administered 2015-07-03: 4 mg via INTRAVENOUS

## 2015-07-03 MED ORDER — METOCLOPRAMIDE HCL 5 MG/ML IJ SOLN
INTRAMUSCULAR | Status: AC
Start: 2015-07-03 — End: 2015-07-03
  Filled 2015-07-03: qty 2

## 2015-07-03 MED ORDER — LACTATED RINGERS IV SOLN
INTRAVENOUS | Status: DC | PRN
  Administered 2015-07-03 (×3): via INTRAVENOUS

## 2015-07-03 MED ORDER — HYDROMORPHONE HCL 1 MG/ML IJ SOLN
INTRAMUSCULAR | Status: DC | PRN
  Administered 2015-07-03: .4 mg via INTRAVENOUS
  Administered 2015-07-03: .3 mg via INTRAVENOUS
  Administered 2015-07-03 (×2): .4 mg via INTRAVENOUS

## 2015-07-03 MED ORDER — LIDOCAINE HCL (CARDIAC) 20 MG/ML IV SOLN
INTRAVENOUS | Status: AC
  Filled 2015-07-03: qty 5

## 2015-07-03 MED ORDER — FENTANYL CITRATE (PF) 100 MCG/2ML IJ SOLN
INTRAMUSCULAR | Status: DC | PRN
  Administered 2015-07-03: 100 ug via INTRAVENOUS

## 2015-07-03 MED ORDER — CEFAZOLIN SODIUM-DEXTROSE 2-4 GM/100ML-% IV SOLN
INTRAVENOUS | Status: AC
  Filled 2015-07-03: qty 100

## 2015-07-03 MED ORDER — PROPOFOL 10 MG/ML IV BOLUS
INTRAVENOUS | Status: AC
  Filled 2015-07-03: qty 40

## 2015-07-03 MED ORDER — CEFAZOLIN SODIUM-DEXTROSE 2-3 GM-% IV SOLR
INTRAVENOUS | Status: DC | PRN
  Administered 2015-07-03: 2 g via INTRAVENOUS

## 2015-07-03 MED ORDER — ROCURONIUM BROMIDE 100 MG/10ML IV SOLN
INTRAVENOUS | Status: DC | PRN
  Administered 2015-07-03: 5 mg via INTRAVENOUS
  Administered 2015-07-03: 40 mg via INTRAVENOUS
  Administered 2015-07-03 (×3): 10 mg via INTRAVENOUS
  Administered 2015-07-03: 5 mg via INTRAVENOUS

## 2015-07-03 MED ORDER — METOCLOPRAMIDE HCL 5 MG/ML IJ SOLN
INTRAMUSCULAR | Status: AC
  Filled 2015-07-03: qty 2

## 2015-07-03 MED ORDER — ESTRADIOL 0.1 MG/GM VA CREA
TOPICAL_CREAM | VAGINAL | Status: DC | PRN
  Administered 2015-07-03: 2 via VAGINAL

## 2015-07-03 MED ORDER — FLUORESCEIN SODIUM 10 % IV SOLN
INTRAVENOUS | Status: AC
  Filled 2015-07-03: qty 5

## 2015-07-03 MED ORDER — MORPHINE SULFATE (PF) 2 MG/ML IV SOLN: 2.0000 mg | INTRAVENOUS | Status: DC | PRN

## 2015-07-03 MED ORDER — ONDANSETRON HCL 4 MG/2ML IJ SOLN: 4.0000 mg | INTRAMUSCULAR | Status: DC | PRN

## 2015-07-03 MED ORDER — ONDANSETRON HCL 4 MG/2ML IJ SOLN
INTRAMUSCULAR | Status: AC
  Filled 2015-07-03: qty 2

## 2015-07-03 MED ORDER — SODIUM CHLORIDE 0.9 % IR SOLN
Status: DC | PRN
  Administered 2015-07-03: 1 via INTRAVESICAL

## 2015-07-03 MED ORDER — HYDROMORPHONE HCL 1 MG/ML IJ SOLN: 0.2500 mg | INTRAMUSCULAR | Status: DC | PRN

## 2015-07-03 MED ORDER — HYDROMORPHONE HCL 2 MG/ML IJ SOLN
INTRAMUSCULAR | Status: AC
  Filled 2015-07-03: qty 1

## 2015-07-03 MED ORDER — FENTANYL CITRATE (PF) 100 MCG/2ML IJ SOLN
INTRAMUSCULAR | Status: AC
Start: 2015-07-03 — End: 2015-07-03
  Filled 2015-07-03: qty 2

## 2015-07-03 MED ORDER — HYDROCODONE-ACETAMINOPHEN 5-325 MG PO TABS: 1.0000 | ORAL_TABLET | ORAL | Status: DC | PRN

## 2015-07-03 MED ORDER — PHENAZOPYRIDINE HCL 200 MG PO TABS
200.0000 mg | ORAL_TABLET | Freq: Once | ORAL | Status: AC
  Administered 2015-07-03: 200 mg via ORAL
  Filled 2015-07-03: qty 1

## 2015-07-03 MED ORDER — PROMETHAZINE HCL 25 MG/ML IJ SOLN: 6.2500 mg | INTRAMUSCULAR | Status: DC | PRN

## 2015-07-03 MED ORDER — ACETAMINOPHEN 10 MG/ML IV SOLN
INTRAVENOUS | Status: AC
Start: 2015-07-03 — End: 2015-07-03
  Filled 2015-07-03: qty 100

## 2015-07-03 MED ORDER — SUGAMMADEX SODIUM 200 MG/2ML IV SOLN
INTRAVENOUS | Status: AC
Start: 2015-07-03 — End: 2015-07-03
  Filled 2015-07-03: qty 4

## 2015-07-03 MED ORDER — GLYCOPYRROLATE 0.2 MG/ML IV SOSY
PREFILLED_SYRINGE | INTRAVENOUS | Status: DC | PRN
  Administered 2015-07-03: .1 mg via INTRAVENOUS

## 2015-07-03 MED ORDER — DEXAMETHASONE SODIUM PHOSPHATE 10 MG/ML IJ SOLN
INTRAMUSCULAR | Status: AC
Start: 2015-07-03 — End: 2015-07-03
  Filled 2015-07-03: qty 1

## 2015-07-03 MED ORDER — BELLADONNA ALKALOIDS-OPIUM 16.2-60 MG RE SUPP
RECTAL | Status: AC
Start: 2015-07-03 — End: 2015-07-03
  Filled 2015-07-03: qty 1

## 2015-07-03 SURGICAL SUPPLY — 63 items
ALLOGRAFT TUTOPLAST AXIS 6X12 (Tissue) ×2 IMPLANT
BAG URINE DRAINAGE (UROLOGICAL SUPPLIES) ×4 IMPLANT
BAG URO CATCHER STRL LF (MISCELLANEOUS) ×4 IMPLANT
BLADE SURG 15 STRL LF DISP TIS (BLADE) ×2 IMPLANT
BLADE SURG 15 STRL SS (BLADE) ×2
CATH FOLEY 2WAY SLVR  5CC 14FR (CATHETERS) ×2
CATH FOLEY 2WAY SLVR 5CC 14FR (CATHETERS) ×2 IMPLANT
CLOTH BEACON ORANGE TIMEOUT ST (SAFETY) ×8 IMPLANT
COVER MAYO STAND STRL (DRAPES) ×4 IMPLANT
COVER SURGICAL LIGHT HANDLE (MISCELLANEOUS) ×4 IMPLANT
DEVICE CAPIO SLIM SINGLE (INSTRUMENTS) ×12 IMPLANT
DRAIN PENROSE 18X1/4 LTX STRL (WOUND CARE) ×4 IMPLANT
DRAPE SHEET LG 3/4 BI-LAMINATE (DRAPES) ×4 IMPLANT
DRAPE STERI URO 9X17 APER PCH (DRAPES) ×4 IMPLANT
ELECT LIGASURE LONG (ELECTRODE) ×4 IMPLANT
ELECT LIGASURE SHORT 9 REUSE (ELECTRODE) ×8 IMPLANT
ELECT PENCIL ROCKER SW 15FT (MISCELLANEOUS) ×4 IMPLANT
GAUZE PACKING 2X5 YD STRL (GAUZE/BANDAGES/DRESSINGS) ×16 IMPLANT
GAUZE SPONGE 4X4 16PLY XRAY LF (GAUZE/BANDAGES/DRESSINGS) ×24 IMPLANT
GLOVE BIO SURGEON STRL SZ 6.5 (GLOVE) ×3 IMPLANT
GLOVE BIO SURGEONS STRL SZ 6.5 (GLOVE) ×1
GLOVE BIOGEL M STRL SZ7.5 (GLOVE) ×8 IMPLANT
GLOVE BIOGEL PI IND STRL 7.0 (GLOVE) ×2 IMPLANT
GLOVE BIOGEL PI INDICATOR 7.0 (GLOVE) ×2
GLOVE ECLIPSE 6.5 STRL STRAW (GLOVE) ×4 IMPLANT
GLOVE ECLIPSE 8.5 STRL (GLOVE) ×4 IMPLANT
GOWN STRL REUS W/TWL XL LVL3 (GOWN DISPOSABLE) ×16 IMPLANT
HOLDER FOLEY CATH W/STRAP (MISCELLANEOUS) ×4 IMPLANT
IV NS 1000ML (IV SOLUTION) ×2
IV NS 1000ML BAXH (IV SOLUTION) ×2 IMPLANT
KIT BASIN OR (CUSTOM PROCEDURE TRAY) ×4 IMPLANT
MANIFOLD NEPTUNE II (INSTRUMENTS) ×4 IMPLANT
NEEDLE HYPO 22GX1.5 SAFETY (NEEDLE) ×4 IMPLANT
NEEDLE MAYO 6 CRC TAPER PT (NEEDLE) ×4 IMPLANT
NEEDLE SPNL 22GX3.5 QUINCKE BK (NEEDLE) ×4 IMPLANT
NS IRRIG 1000ML POUR BTL (IV SOLUTION) ×8 IMPLANT
PACK CYSTO (CUSTOM PROCEDURE TRAY) ×4 IMPLANT
PACK GENERAL/GYN (CUSTOM PROCEDURE TRAY) ×4 IMPLANT
PLUG CATH AND CAP STER (CATHETERS) ×4 IMPLANT
RETRACTOR STAY HOOK 5MM (MISCELLANEOUS) ×4 IMPLANT
SHEET LAVH (DRAPES) ×4 IMPLANT
SPONGE LAP 4X18 X RAY DECT (DISPOSABLE) ×4 IMPLANT
SUT CAPIO ETHIBPND (SUTURE) ×16 IMPLANT
SUT VIC AB 0 CT1 18XCR BRD 8 (SUTURE) ×2 IMPLANT
SUT VIC AB 0 CT1 8-18 (SUTURE) ×2
SUT VIC AB 2-0 CT1 27 (SUTURE) ×2
SUT VIC AB 2-0 CT1 27XBRD (SUTURE) ×2 IMPLANT
SUT VIC AB 2-0 SH 27 (SUTURE) ×8
SUT VIC AB 2-0 SH 27X BRD (SUTURE) ×8 IMPLANT
SUT VIC AB 3-0 SH 27 (SUTURE) ×4
SUT VIC AB 3-0 SH 27XBRD (SUTURE) ×4 IMPLANT
SUT VIC AB 4-0 PS2 18 (SUTURE) ×24 IMPLANT
SUT VICRYL 0 UR6 27IN ABS (SUTURE) ×8 IMPLANT
SYRINGE 10CC LL (SYRINGE) ×4 IMPLANT
TOWEL OR 17X26 10 PK STRL BLUE (TOWEL DISPOSABLE) ×12 IMPLANT
TOWEL OR NON WOVEN STRL DISP B (DISPOSABLE) ×4 IMPLANT
TUBING CONNECTING 10 (TUBING) ×3 IMPLANT
TUBING CONNECTING 10' (TUBING) ×1
TUTOPLAST AXIS 6X12 (Tissue) ×4 IMPLANT
WATER STERILE IRR 1000ML POUR (IV SOLUTION) ×4 IMPLANT
WATER STERILE IRR 1500ML POUR (IV SOLUTION) ×4 IMPLANT
WATER STERILE IRR 500ML POUR (IV SOLUTION) ×4 IMPLANT
YANKAUER SUCT BULB TIP 10FT TU (MISCELLANEOUS) ×4 IMPLANT

## 2015-07-03 NOTE — Progress Notes (Signed)
CBC with Diff drawn by lab.

## 2015-07-03 NOTE — Progress Notes (Signed)
Minimal pain Labs OK Looks great

## 2015-07-03 NOTE — Op Note (Signed)
Preoperative diagnosis: Vault prolapse and cystocele and small rectocele Postoperative diagnosis: Vault prolapse and cystocele and small rectocele Surgery: Vault suspension and cystocele repair and graft and cystoscopy Surgeon: Dr. Nicki Reaper Mirayah Wren Assistant: Dr. Eduard Clos  The patient has the above diagnoses and consented to the above procedure. The patient underwent a transvaginal hysterectomy and removal of the left fallopian tube and right tube and ovary. The vaginal cuff was opened and run posteriorly. The week ureteral sacral ligaments were tagged. She did have a very long capacious vagina  With my Allis clamps I made my usual T-shaped incision after instilling 25 mL of a lidocaine epinephrine mixture. The tissue was very thickened and fibrous and it took a very long time to mobilize the overlying vaginal wall mucosa from the underlying pubocervical fascia to the white line bilaterally. The length of her anterior vaginal wall from left to right was tremendous. I stayed on the white tissue and was very diligent in mobilizing at the apex.  I had cystoscoped the patient before I started and there was efflux out of both ureters. There was delay on the right and the patient is known to have a dilated right ureter documented. It took a long time before I saw excellent jets especially on the right. She had excellent urine output throughout the entire case.  I did an anterior repair with running 2-0 Vicryl imbricating the pubocervical fascia as an anterior repair not imbricating the bladder neck. I was very happy with the reduction of the cystocele. 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 I did not distort the anatomy  I re-cystoscoped the patient with the exact same findings as above again waiting a few moments on the patient's right side. There was no distortion of the trigone or ureters and no injury  Utilizing my usual technique I finger dissected along the  appropriate plane to the iscial spine bilaterally mobilizing soft tissue medially. I was very pleased with the medial mobilization. She did not have a significant sacrospinous ligament on either side. A 0 Vicryl suture was placed into this left sacrospinous ligament 1 full finger breath medial to the spine in a straight line between the spines. I passed on the right side 4 or 5 times and a could not grasp tissue and when I did it would pull through since it was so weak. I came a little more caudal into the sidewall and placed a suture into the iliococcygeus muscle and was very pleased with its position. I lost a little bit a length but the strength appeared excellent and I was well away from the spine and ureter  I did my usual sharp dissection at the urethrovesical angle and placed a 0 Vicryl on a UR 6 needle in both locations.  A well-prepared 10 x 6 dermal graft the shape of a trapezoid was sewn in place with the usual technique. I trimmed an appropriate amount of the vaginal wall tissue and closed the anterior vaginal wall with 2-0 Vicryl and CT1 needle  The graft was pulled underneath the repair and at 5 and 7:00 I used a 2-0 Vicryl to gently approximate redundant apical tissue laterally and posteriorly to the graft.  Dr. cousins closed the apex and did this plication dictated.  At the end of the case the patient had excellent vaginal length. She had excellent clear urine output. Leg position was excellent. Blood loss was 300 mL. There is no question she would not benefit from a  posterior repair. She had a lot of apical length to her vagina and I could evert this soft tissue outward under traction but it inverted nicely at the end of the case. The vagina was very long relative to the bony landmarks noted above  Hopefully the patient will reach her treatment goal. The operation took many hours based upon her anatomy and the amount of dissection required.

## 2015-07-03 NOTE — Interval H&P Note (Signed)
History and Physical Interval Note:  07/03/2015 7:33 AM  Sandra [name]  has presented today for surgery, with the diagnosis of [place]   The various methods of treatment have been discussed with the patient and family. After consideration of risks, benefits and other options for treatment, the patient has consented to  Procedure(s): CYSTOSCOPY (N/A) [place]  (N/A) VAGINAL VAULT PROLAPSE WITH GRAFT  (N/A) HYSTERECTOMY VAGINAL (N/A) SALPINGO OOPHORECTOMY (Bilateral) as a surgical intervention .  The patient's history has been reviewed, patient examined, no change in status, stable for surgery.  I have reviewed the patient's chart and labs.  Questions were answered to the patient's satisfaction.     Dahlton Hinde A

## 2015-07-03 NOTE — Anesthesia Procedure Notes (Signed)
Procedure Name: Intubation Date/Time: 07/03/2015 7:38 AM Performed by: Rudine Rieger, Virgel Gess Pre-anesthesia Checklist: Patient identified, Emergency Drugs available, Suction available, Patient being monitored and Timeout performed Patient Re-evaluated:Patient Re-evaluated prior to inductionOxygen Delivery Method: Circle system utilized Preoxygenation: Pre-oxygenation with 100% oxygen Intubation Type: IV induction Ventilation: Mask ventilation without difficulty Laryngoscope Size: Mac and 4 Grade View: Grade I Tube type: Oral Tube size: 7.0 mm Number of attempts: 1 Airway Equipment and Method: Stylet Placement Confirmation: ETT inserted through vocal cords under direct vision,  positive ETCO2,  CO2 detector and breath sounds checked- equal and bilateral Secured at: 21 cm Tube secured with: Tape Dental Injury: Teeth and Oropharynx as per pre-operative assessment  Comments: Easy intubation by EMS Student, easy mask, VSS.

## 2015-07-03 NOTE — Care Management Obs Status (Signed)
[place] NOTIFICATION   Patient Details  Name: Sandra [name] MRN: [id number] Date of Birth: November 15, 1940   Medicare Observation Status Notification Given:  Yes    Dessa Phi, RN 07/03/2015, 4:46 PM

## 2015-07-03 NOTE — Brief Op Note (Signed)
07/03/2015  12:47 PM  PATIENT:  Sandra [name]  75 y.o. female  PRE-OPERATIVE DIAGNOSIS:   UTEROVAGINAL PROLAPSE, CYSTOCELE, RECTOCELE AND VAULT PROLAPSE   POST-OPERATIVE DIAGNOSIS:  UTEROVAGINAL PROLAPSE,  CYSTOCELE , AND VAULT PROLAPSE   PROCEDURE:  TVHBSO  SURGEON:  Surgeon(s) and Role: Panel 1:    * Servando Salina, MD- Primary  Panel 2:    * Bjorn Loser, MD - Primary  PHYSICIAN ASSISTANT:   ASSISTANTS: Bjorn Loser, MD  ANESTHESIA:   general FINDINGS;  3rd degree uterine prolapse, surgical separated tubes, atrophic ovaries, large  Cystocele Superficial vaginal erosion EBL:  Total I/O In: 2000 [I.V.:2000] Out: 800 [Urine:500; Blood:300]  BLOOD ADMINISTERED:none  DRAINS: none   LOCAL MEDICATIONS USED:  OTHER lidocaine with epinephrine  SPECIMEN:  Source of Specimen:  uterus with cervix, tubes and ovaries  DISPOSITION OF SPECIMEN:  PATHOLOGY  COUNTS:  YES  TOURNIQUET:  * No tourniquets in log *  DICTATION: .Other Dictation: Dictation Number [id number]  PLAN OF CARE: Admit to inpatient   PATIENT DISPOSITION:  PACU - hemodynamically stable.   Delay start of Pharmacological VTE agent (>24hrs) due to surgical blood loss or risk of bleeding: no

## 2015-07-03 NOTE — Anesthesia Postprocedure Evaluation (Signed)
Anesthesia Post Note  Patient: Sandra [name]  Procedure(s) Performed: Procedure(s) (LRB): CYSTOSCOPY (N/A) REPAIR CYSTOCELE    (N/A) VAGINAL VAULT PROLAPSE WITH GRAFT  (N/A) HYSTERECTOMY VAGINAL (N/A) SALPINGO OOPHORECTOMY (Bilateral)  Patient location during evaluation: PACU Anesthesia Type: General Level of consciousness: awake and alert Pain management: pain level controlled Vital Signs Assessment: post-procedure vital signs reviewed and stable Respiratory status: spontaneous breathing, nonlabored ventilation, respiratory function stable and patient connected to nasal cannula oxygen Cardiovascular status: blood pressure returned to baseline and stable Postop Assessment: no signs of nausea or vomiting Anesthetic complications: no    Last Vitals:  Filed Vitals:   07/03/15 0545  BP: 153/88  Pulse: 90  Temp: 37.3 C  Resp: 16    Last Pain: There were no vitals filed for this visit.               Shade Kaley S

## 2015-07-03 NOTE — Transfer of Care (Signed)
Immediate Anesthesia Transfer of Care Note  Patient: Sandra [name]  Procedure(s) Performed: Procedure(s): CYSTOSCOPY (N/A) REPAIR CYSTOCELE    (N/A) VAGINAL VAULT PROLAPSE WITH GRAFT  (N/A) HYSTERECTOMY VAGINAL (N/A) SALPINGO OOPHORECTOMY (Bilateral)  Patient Location: PACU  Anesthesia Type:General  Level of Consciousness: awake, alert , oriented and patient cooperative  Airway & Oxygen Therapy: Patient Spontanous Breathing and Patient connected to face mask oxygen  Post-op Assessment: Report given to RN, Post -op Vital signs reviewed and stable and Patient moving all extremities  Post vital signs: Reviewed and stable  Last Vitals:  Filed Vitals:   07/03/15 0545  BP: 153/88  Pulse: 90  Temp: 37.3 C  Resp: 16    Last Pain: There were no vitals filed for this visit.    Patients Stated Pain Goal: 4 ([id number])  Complications: No apparent anesthesia complications

## 2015-07-03 NOTE — Anesthesia Preprocedure Evaluation (Signed)

## 2015-07-04 DIAGNOSIS — N813 Complete uterovaginal prolapse: Secondary | ICD-10-CM | POA: Diagnosis not present

## 2015-07-04 LAB — BASIC METABOLIC PANEL
Anion gap: 5 (ref 5–15)
BUN: 5 mg/dL — ABNORMAL LOW (ref 6–20)
CALCIUM: 8.1 mg/dL — AB (ref 8.9–10.3)
CO2: 25 mmol/L (ref 22–32)
CREATININE: 0.62 mg/dL (ref 0.44–1.00)
Chloride: 108 mmol/L (ref 101–111)
GFR calc Af Amer: >60 mL/min (ref >60)
GFR calc non Af Amer: >60 mL/min (ref >60)
GLUCOSE: 135 mg/dL — AB (ref 65–99)
Potassium: 3.7 mmol/L (ref 3.5–5.1)
Sodium: 138 mmol/L (ref 135–145)

## 2015-07-04 LAB — CBC
HCT: 29.2 % — ABNORMAL LOW (ref 36.0–46.0)
HEMOGLOBIN: 9.7 g/dL — AB (ref 12.0–15.0)
MCH: 29.3 pg (ref 26.0–34.0)
MCHC: 33.2 g/dL (ref 30.0–36.0)
MCV: 88.2 fL (ref 78.0–100.0)
PLATELETS: 190 10*3/uL (ref 150–400)
RBC: 3.31 MIL/uL — ABNORMAL LOW (ref 3.87–5.11)
RDW: 14.7 % (ref 11.5–15.5)
WBC: 5.7 10*3/uL (ref 4.0–10.5)

## 2015-07-04 MED ORDER — HYDROCODONE-ACETAMINOPHEN 5-325 MG PO TABS: 1.0000 | ORAL_TABLET | Freq: Four times a day (QID) | ORAL | Status: DC | PRN

## 2015-07-04 MED ORDER — SODIUM CHLORIDE 0.9% FLUSH: 3.0000 mL | Freq: Two times a day (BID) | INTRAVENOUS | Status: DC

## 2015-07-04 MED ORDER — SODIUM CHLORIDE 0.9 % IV SOLN
250.0000 mL | INTRAVENOUS | Status: DC | PRN
Start: 2015-07-04 — End: 2015-07-04

## 2015-07-04 MED ORDER — LACTATED RINGERS IV BOLUS (SEPSIS)
500.0000 mL | Freq: Once | INTRAVENOUS | Status: AC
  Administered 2015-07-04: 500 mL via INTRAVENOUS

## 2015-07-04 MED ORDER — SODIUM CHLORIDE 0.9% FLUSH: 3.0000 mL | INTRAVENOUS | Status: DC | PRN

## 2015-07-04 NOTE — Progress Notes (Signed)
POD#1 s/p TVHBSO, ant repair and sling No complaint  O: BP 107/52 mmHg  Pulse 82  Temp(Src) 100.1 F (37.8 C) (Oral)  Resp 16  Ht 5\' 2"  (1.575 m)  Wt 84.823 kg (187 lb)  BMI 34.19 kg/m2  SpO2 100% Lungs clear to A Cor RRR grade 1/vi SEM Abd; soft obese nontender Pad: none: vaginal packing x 2 removed Dried blood on packing Bright red blood per vagina noted SSE done: no further active bleeding noted With sponge on a stick used Cuff visually intact Ext: no edema  Foley in CBC    Component Value Date/Time   WBC 5.7 07/04/2015 0353   RBC 3.31* 07/04/2015 0353   HGB 9.7* 07/04/2015 0353   HCT 29.2* 07/04/2015 0353   PLT 190 07/04/2015 0353   MCV 88.2 07/04/2015 0353   MCH 29.3 07/04/2015 0353   MCHC 33.2 07/04/2015 0353   RDW 14.7 07/04/2015 0353   LYMPHSABS 1.1 07/03/2015 1416   MONOABS 0.1 07/03/2015 1416   EOSABS 0.0 07/03/2015 1416   BASOSABS 0.0 07/03/2015 1416    BMET    Component Value Date/Time   NA 138 07/04/2015 0353   K 3.7 07/04/2015 0353   CL 108 07/04/2015 0353   CO2 25 07/04/2015 0353   GLUCOSE 135* 07/04/2015 0353   BUN 5* 07/04/2015 0353   CREATININE 0.62 07/04/2015 0353   CALCIUM 8.1* 07/04/2015 0353   GFRNONAA >60 07/04/2015 0353   GFRAA >60 07/04/2015 0353  .i    IMP: Postop with transient vaginal bleeding otherwise doing well P) pad . Monitor for vaginal bleeding. Voiding trial. 500cc LR bolus then HL IV D/c home per Dr Matilde Sprang D/c instructions for gyn reviewed with pt.. F/u Maurisio Ruddy 6 weeks, appt already scheduled with dr Matilde Sprang

## 2015-07-04 NOTE — Progress Notes (Signed)
Pt. Ambulated with RN twice on 6/28 with standby assist and no oxygen. Pt. Had no c/o of dizziness and tolerated ambulation very well. Pt. Up in chair.

## 2015-07-04 NOTE — Discharge Summary (Signed)
Date of admission: 07/03/2015  Date of discharge: 07/04/2015  Admission diagnosis: cystocele  Discharge diagnosis: cystocele  Secondary diagnoses: vault and uterine prolapse  History and Physical: For full details, please see admission history and physical. Briefly, Sandra [name] is a 75 y.o. year old patient with the above diagnosis.   Hospital Course: Prolapse surgery and post op course uneventful  Laboratory values:  Recent Labs  07/03/15 1416 07/04/15 0353  HGB 11.9* 9.7*  HCT 36.1 29.2*    Recent Labs  07/04/15 0353  CREATININE 0.62    Disposition: Home  Discharge instruction: The patient was instructed to be ambulatory but told to refrain from heavy lifting, strenuous activity, or driving. Detailed  Discharge medications:    Medication List    ASK your doctor about these medications        acetaminophen 325 MG tablet  Commonly known as:  TYLENOL  Take 650 mg by mouth every 6 (six) hours as needed for moderate pain or headache.     CALTRATE 600+D PO  Take 1 tablet by mouth 2 (two) times daily.     clotrimazole 1 % cream  Commonly known as:  LOTRIMIN  Apply 1 application topically 2 (two) times daily as needed (itching between toes).     hydrocortisone cream 1 %  Apply 1 application topically 2 (two) times daily as needed for itching.     traMADol 50 MG tablet  Commonly known as:  ULTRAM  Take 1 tablet (50 mg total) by mouth every 6 (six) hours as needed.     Vitamin D 2000 units tablet  Take 2,000 Units by mouth daily.        Followup:      Follow-up Information    Follow up with COUSINS,SHERONETTE A, MD In 6 weeks.   Specialty:  Obstetrics and Gynecology   Contact information:   [address] [place] [address] [phone]       Follow up with Crystle Carelli A, MD.   Specialty:  Urology   Why:  as scheduled   Contact information:   [place] [address] [phone]

## 2015-07-04 NOTE — Op Note (Signed)
Sandra [name], Sandra [name]                 ACCOUNT NO.:  [id number]  MEDICAL RECORD NO.:  [id number]  LOCATION:  [id number]                         FACILITY:  [facility]  PHYSICIAN:  Sandra [name], M.D.DATE OF BIRTH:  07-01-1940  DATE OF PROCEDURE:  07/03/2015 DATE OF DISCHARGE:                              OPERATIVE REPORT   PREOPERATIVE DIAGNOSES:  Uterovaginal prolapse, vaginal vault prolapse, cystocele, rectocele.  PROCEDURE:  Total vaginal hysterectomy, bilateral salpingo-oophorectomy.  POSTOPERATIVE DIAGNOSES:  Uterovaginal prolapse, vault prolapse, cystocele.  ANESTHESIA:  General.  SURGEON:  Sandra [name], M.D.  ASSISTANT:  Sandra [name], M.D.  DESCRIPTION OF PROCEDURE:  Under adequate general anesthesia, the patient was placed in the dorsal lithotomy position.  Her legs were positioned in the stirrups prior to being prepped and draped. Examination under anesthesia was remarkable for a third-degree uterine prolapse.  Bimanual examination did not reveal any adnexal masses.  The patient was sterilely prepped and draped in usual fashion. Foley catheter was placed in the bladder without the attached bag.  The procedure was started with returning the uterus into the vagina.  A weighted speculum was placed in the vagina.  The anterior and posterior lip of the cervix were grasped with a Jacobson clamp.  A dilute solution of lidocaine with Xylocaine with 1:100,000 epinephrine was injected circumferentially at what appears to be the cervicovaginal junction. With the anterior vaginal wall retractors, a circumferential incision was then made.  Using Mayo scissors posteriorly, a sharp dissection was done with the posterior cul-de-sac being opened and the incision was manually extended.  The vaginal cuff at that point posteriorly was oversewn with 0 Vicryl running lock stitch.  The weighted speculum was repositioned into the pelvic cavity and the uterosacral ligaments were  bilaterally clamped, cut, and suture ligated with 0 Vicryl suture.  At that point, attention was then turned to the anterior cul-de-sac.  Using sharp dissection, the anterior cul-de-sac was undermined, part of the thin peritoneum was noted and suspicion for the posterior aspect of the bladder was then suspected.  At this point, the dissection was moved more distally and on that dissection, the anterior cul-de-sac was finally opened.  Prior to that, continued serially bilateral clamp, cauterizing and cutting of the cardinal ligament performed, trying to reach the lower uterine segment anteriorly.  This was continued to be done until the cul-de-sac was opened.  The uterine vessels bilateral then serially clamped, cauterized, and cut.  There was bleeding on the right lateral wall of the vagina which resulted in [place] clamp with a suture ligation of that bleeder with good hemostasis being achieved. Continued with clamp, cauterizing, and cutting  of the attachments until the fundus was flipped and brought into view with the cervix still Outside. That allowed for posterior viewing of both ovaries and both transected fallopian tubes.  With the retractors in place and the ovary in view, the IP ligaments on the patient's right side were serially clamped, cauterized, and then cut, removing both tube and ovary. On the left side, the distal portion of the transected tube was noted.  The mesosalpinx was serially clamped, cauterized, and then cut, removing of that to allow for more exposure to  the IP ligament on the left and the IP was then serially clamped, cauterized, and cut with removal of the uterus, proximal tube and  the left ovary with the distal end of tube separate. At that point, the procedure was turned over to Dr. Matilde [name] to perform a cystoscopy, subsequently anterior cystocele repair and vault suspension.  Please see his dictated operative note for further details. Once cystocele was  done, Sandra [name] culdoplasty was utilized to approximate the posterior cul-de-sac and the uterosacral ligaments into the midline with 0 Vicryl. The vaginal cuff was closed with interrupted 0 Vicryl figure-of-eight sutures.  The vagina was copiously irrigated and suctioned and  Estrogen soaked packing was placed.  SPECIMEN:  Uterus with cervix, tubes, and ovaries sent to Pathology.  ESTIMATED BLOOD LOSS:  300 mL.  INTRAOPERATIVE FLUIDS:  2200 mL.  URINE OUTPUT:  500 mL clear yellow urine.  COUNTS:  Sponge and instrument counts x2 were correct.  COMPLICATIONS:  None.  The patient tolerated the procedure well, was transferred to recovery in stable condition.     Sandra [name], M.D.     Pony/MEDQ  D:  07/03/2015  T:  07/04/2015  Job:  [id number]

## 2015-07-04 NOTE — Discharge Instructions (Signed)
Call if temperature greater than equal to 100.4, nothing per vagina for 4-6 weeks or severe nausea vomiting, increased incisional pain , no straining with bowel movements, showers no bath,call if soaking a pad every hour or more frequentlyI have reviewed discharge instructions in detail with the patient. They will follow-up with me or their physician as scheduled. My nurse will also be calling the patients as per protocol.

## 2015-09-03 ENCOUNTER — Other Ambulatory Visit: Payer: Self-pay | Admitting: Internal Medicine

## 2015-09-03 DIAGNOSIS — Z1231 Encounter for screening mammogram for malignant neoplasm of breast: Secondary | ICD-10-CM

## 2015-10-29 ENCOUNTER — Ambulatory Visit
Admission: RE | Admit: 2015-10-29 | Discharge: 2015-10-29 | Disposition: A | Discharge status: Home / Self Care | Payer: Medicare Other | Source: Ambulatory Visit | Attending: Internal Medicine | Admitting: Internal Medicine

## 2015-10-29 DIAGNOSIS — Z1231 Encounter for screening mammogram for malignant neoplasm of breast: Secondary | ICD-10-CM

## 2016-09-15 ENCOUNTER — Other Ambulatory Visit: Payer: Self-pay | Admitting: Internal Medicine

## 2016-09-15 ENCOUNTER — Ambulatory Visit
Admission: RE | Admit: 2016-09-15 | Discharge: 2016-09-15 | Disposition: A | Discharge status: Home / Self Care | Payer: Medicare Other | Source: Ambulatory Visit | Attending: Internal Medicine | Admitting: Internal Medicine

## 2016-09-15 DIAGNOSIS — M25561 Pain in right knee: Secondary | ICD-10-CM

## 2016-09-26 ENCOUNTER — Other Ambulatory Visit: Payer: Self-pay | Admitting: Internal Medicine

## 2016-09-26 DIAGNOSIS — Z1239 Encounter for other screening for malignant neoplasm of breast: Secondary | ICD-10-CM

## 2016-11-03 ENCOUNTER — Ambulatory Visit
Admission: RE | Admit: 2016-11-03 | Discharge: 2016-11-03 | Disposition: A | Discharge status: Home / Self Care | Payer: Medicare Other | Source: Ambulatory Visit | Attending: Internal Medicine | Admitting: Internal Medicine

## 2016-11-03 DIAGNOSIS — Z1239 Encounter for other screening for malignant neoplasm of breast: Secondary | ICD-10-CM

## 2017-09-28 ENCOUNTER — Other Ambulatory Visit: Payer: Self-pay | Admitting: Internal Medicine

## 2017-09-28 DIAGNOSIS — Z1231 Encounter for screening mammogram for malignant neoplasm of breast: Secondary | ICD-10-CM

## 2017-11-09 ENCOUNTER — Ambulatory Visit
Admission: RE | Admit: 2017-11-09 | Discharge: 2017-11-09 | Disposition: A | Discharge status: Home / Self Care | Payer: Medicare Other | Source: Ambulatory Visit | Attending: Internal Medicine | Admitting: Internal Medicine

## 2017-11-09 DIAGNOSIS — Z1231 Encounter for screening mammogram for malignant neoplasm of breast: Secondary | ICD-10-CM

## 2018-10-01 ENCOUNTER — Other Ambulatory Visit: Payer: Self-pay | Admitting: Internal Medicine

## 2018-10-01 DIAGNOSIS — Z1231 Encounter for screening mammogram for malignant neoplasm of breast: Secondary | ICD-10-CM

## 2018-11-22 ENCOUNTER — Other Ambulatory Visit: Payer: Self-pay

## 2018-11-22 ENCOUNTER — Ambulatory Visit
Admission: RE | Admit: 2018-11-22 | Discharge: 2018-11-22 | Disposition: A | Discharge status: Home / Self Care | Payer: Medicare Other | Source: Ambulatory Visit | Attending: Internal Medicine | Admitting: Internal Medicine

## 2018-11-22 DIAGNOSIS — Z1231 Encounter for screening mammogram for malignant neoplasm of breast: Secondary | ICD-10-CM

## 2019-07-21 ENCOUNTER — Other Ambulatory Visit: Payer: Self-pay | Admitting: Obstetrics and Gynecology

## 2019-07-21 DIAGNOSIS — M8580 Other specified disorders of bone density and structure, unspecified site: Secondary | ICD-10-CM

## 2019-09-20 ENCOUNTER — Other Ambulatory Visit: Payer: Self-pay | Admitting: Internal Medicine

## 2019-09-20 DIAGNOSIS — Z1231 Encounter for screening mammogram for malignant neoplasm of breast: Secondary | ICD-10-CM

## 2019-10-22 ENCOUNTER — Ambulatory Visit: Payer: Medicare Other | Attending: Internal Medicine

## 2019-10-22 DIAGNOSIS — Z23 Encounter for immunization: Secondary | ICD-10-CM

## 2019-10-22 NOTE — Progress Notes (Signed)
   Covid-19 Vaccination Clinic  Name:  Sandra [name]    MRN: [id number] DOB: [birth year]  10/22/2019  Sandra [name] was observed post Covid-19 immunization for 15 minutes without incident. She was provided with Vaccine Information Sheet and instruction to access the V-Safe system.   Sandra [name] was instructed to call 911 with any severe reactions post vaccine: Marland Kitchen Difficulty breathing  . Swelling of face and throat  . A fast heartbeat  . A bad rash all over body  . Dizziness and weakness

## 2019-12-12 ENCOUNTER — Ambulatory Visit
Admission: RE | Admit: 2019-12-12 | Discharge: 2019-12-12 | Disposition: A | Discharge status: Home / Self Care | Payer: Medicare Other | Source: Ambulatory Visit | Attending: Obstetrics and Gynecology | Admitting: Obstetrics and Gynecology

## 2019-12-12 ENCOUNTER — Other Ambulatory Visit: Payer: Self-pay

## 2019-12-12 ENCOUNTER — Ambulatory Visit
Admission: RE | Admit: 2019-12-12 | Discharge: 2019-12-12 | Disposition: A | Discharge status: Home / Self Care | Payer: Medicare Other | Source: Ambulatory Visit | Attending: Internal Medicine | Admitting: Internal Medicine

## 2019-12-12 DIAGNOSIS — Z1231 Encounter for screening mammogram for malignant neoplasm of breast: Secondary | ICD-10-CM

## 2019-12-12 DIAGNOSIS — M8580 Other specified disorders of bone density and structure, unspecified site: Secondary | ICD-10-CM

## 2020-01-09 DIAGNOSIS — M25561 Pain in right knee: Secondary | ICD-10-CM | POA: Diagnosis not present

## 2020-01-09 DIAGNOSIS — M179 Osteoarthritis of knee, unspecified: Secondary | ICD-10-CM | POA: Diagnosis not present

## 2020-02-13 ENCOUNTER — Ambulatory Visit
Admission: RE | Admit: 2020-02-13 | Discharge: 2020-02-13 | Disposition: A | Discharge status: Home / Self Care | Payer: Medicare Other | Source: Ambulatory Visit | Attending: Internal Medicine | Admitting: Internal Medicine

## 2020-02-13 ENCOUNTER — Other Ambulatory Visit: Payer: Self-pay | Admitting: Internal Medicine

## 2020-02-13 DIAGNOSIS — M25561 Pain in right knee: Secondary | ICD-10-CM

## 2020-02-13 DIAGNOSIS — M25461 Effusion, right knee: Secondary | ICD-10-CM | POA: Diagnosis not present

## 2020-02-13 DIAGNOSIS — M179 Osteoarthritis of knee, unspecified: Secondary | ICD-10-CM | POA: Diagnosis not present

## 2020-02-13 DIAGNOSIS — M1711 Unilateral primary osteoarthritis, right knee: Secondary | ICD-10-CM | POA: Diagnosis not present

## 2020-02-27 DIAGNOSIS — M25561 Pain in right knee: Secondary | ICD-10-CM | POA: Diagnosis not present

## 2020-03-19 DIAGNOSIS — E0520 Thyrotoxicosis with toxic multinodular goiter without thyrotoxic crisis or storm: Secondary | ICD-10-CM | POA: Diagnosis not present

## 2020-03-19 DIAGNOSIS — M25561 Pain in right knee: Secondary | ICD-10-CM | POA: Diagnosis not present

## 2020-03-26 DIAGNOSIS — M8580 Other specified disorders of bone density and structure, unspecified site: Secondary | ICD-10-CM | POA: Diagnosis not present

## 2020-03-26 DIAGNOSIS — E0520 Thyrotoxicosis with toxic multinodular goiter without thyrotoxic crisis or storm: Secondary | ICD-10-CM | POA: Diagnosis not present

## 2020-04-02 DIAGNOSIS — M1711 Unilateral primary osteoarthritis, right knee: Secondary | ICD-10-CM | POA: Diagnosis not present

## 2020-05-07 DIAGNOSIS — H25011 Cortical age-related cataract, right eye: Secondary | ICD-10-CM | POA: Diagnosis not present

## 2020-05-07 DIAGNOSIS — H2513 Age-related nuclear cataract, bilateral: Secondary | ICD-10-CM | POA: Diagnosis not present

## 2020-05-07 DIAGNOSIS — H5203 Hypermetropia, bilateral: Secondary | ICD-10-CM | POA: Diagnosis not present

## 2020-05-21 DIAGNOSIS — H25811 Combined forms of age-related cataract, right eye: Secondary | ICD-10-CM | POA: Diagnosis not present

## 2020-05-21 DIAGNOSIS — H2521 Age-related cataract, morgagnian type, right eye: Secondary | ICD-10-CM | POA: Diagnosis not present

## 2020-05-21 DIAGNOSIS — H2589 Other age-related cataract: Secondary | ICD-10-CM | POA: Diagnosis not present

## 2020-07-02 DIAGNOSIS — Z961 Presence of intraocular lens: Secondary | ICD-10-CM | POA: Diagnosis not present

## 2020-10-09 ENCOUNTER — Other Ambulatory Visit: Payer: Self-pay | Admitting: Internal Medicine

## 2020-10-09 ENCOUNTER — Other Ambulatory Visit: Payer: Self-pay | Admitting: Obstetrics and Gynecology

## 2020-10-09 DIAGNOSIS — Z1231 Encounter for screening mammogram for malignant neoplasm of breast: Secondary | ICD-10-CM

## 2020-10-22 DIAGNOSIS — Z79899 Other long term (current) drug therapy: Secondary | ICD-10-CM | POA: Diagnosis not present

## 2020-10-22 DIAGNOSIS — R030 Elevated blood-pressure reading, without diagnosis of hypertension: Secondary | ICD-10-CM | POA: Diagnosis not present

## 2020-10-22 DIAGNOSIS — Z0000 Encounter for general adult medical examination without abnormal findings: Secondary | ICD-10-CM | POA: Diagnosis not present

## 2020-10-22 DIAGNOSIS — Z23 Encounter for immunization: Secondary | ICD-10-CM | POA: Diagnosis not present

## 2020-10-22 DIAGNOSIS — R7309 Other abnormal glucose: Secondary | ICD-10-CM | POA: Diagnosis not present

## 2020-10-22 DIAGNOSIS — R209 Unspecified disturbances of skin sensation: Secondary | ICD-10-CM | POA: Diagnosis not present

## 2020-10-22 DIAGNOSIS — Z1389 Encounter for screening for other disorder: Secondary | ICD-10-CM | POA: Diagnosis not present

## 2020-10-22 DIAGNOSIS — E559 Vitamin D deficiency, unspecified: Secondary | ICD-10-CM | POA: Diagnosis not present

## 2020-10-22 DIAGNOSIS — M179 Osteoarthritis of knee, unspecified: Secondary | ICD-10-CM | POA: Diagnosis not present

## 2020-10-22 DIAGNOSIS — E039 Hypothyroidism, unspecified: Secondary | ICD-10-CM | POA: Diagnosis not present

## 2020-10-22 DIAGNOSIS — J309 Allergic rhinitis, unspecified: Secondary | ICD-10-CM | POA: Diagnosis not present

## 2020-12-17 ENCOUNTER — Ambulatory Visit
Admission: RE | Admit: 2020-12-17 | Discharge: 2020-12-17 | Disposition: A | Discharge status: Home / Self Care | Payer: Medicare Other | Source: Ambulatory Visit | Attending: Obstetrics and Gynecology | Admitting: Obstetrics and Gynecology

## 2020-12-17 ENCOUNTER — Other Ambulatory Visit: Payer: Self-pay

## 2020-12-17 DIAGNOSIS — Z1231 Encounter for screening mammogram for malignant neoplasm of breast: Secondary | ICD-10-CM

## 2021-01-14 DIAGNOSIS — Z961 Presence of intraocular lens: Secondary | ICD-10-CM | POA: Diagnosis not present

## 2021-02-20 DIAGNOSIS — M65331 Trigger finger, right middle finger: Secondary | ICD-10-CM | POA: Diagnosis not present

## 2021-07-29 DIAGNOSIS — Z923 Personal history of irradiation: Secondary | ICD-10-CM | POA: Diagnosis not present

## 2021-07-29 DIAGNOSIS — E0520 Thyrotoxicosis with toxic multinodular goiter without thyrotoxic crisis or storm: Secondary | ICD-10-CM | POA: Diagnosis not present

## 2021-07-29 DIAGNOSIS — E559 Vitamin D deficiency, unspecified: Secondary | ICD-10-CM | POA: Diagnosis not present

## 2021-07-29 DIAGNOSIS — E042 Nontoxic multinodular goiter: Secondary | ICD-10-CM | POA: Diagnosis not present

## 2021-11-11 DIAGNOSIS — R29898 Other symptoms and signs involving the musculoskeletal system: Secondary | ICD-10-CM | POA: Diagnosis not present

## 2021-11-11 DIAGNOSIS — R7303 Prediabetes: Secondary | ICD-10-CM | POA: Diagnosis not present

## 2021-11-11 DIAGNOSIS — M8589 Other specified disorders of bone density and structure, multiple sites: Secondary | ICD-10-CM | POA: Diagnosis not present

## 2021-11-11 DIAGNOSIS — I10 Essential (primary) hypertension: Secondary | ICD-10-CM | POA: Diagnosis not present

## 2021-11-11 DIAGNOSIS — E7800 Pure hypercholesterolemia, unspecified: Secondary | ICD-10-CM | POA: Diagnosis not present

## 2021-11-11 DIAGNOSIS — M1711 Unilateral primary osteoarthritis, right knee: Secondary | ICD-10-CM | POA: Diagnosis not present

## 2021-11-11 DIAGNOSIS — E042 Nontoxic multinodular goiter: Secondary | ICD-10-CM | POA: Diagnosis not present

## 2021-11-11 DIAGNOSIS — Z0000 Encounter for general adult medical examination without abnormal findings: Secondary | ICD-10-CM | POA: Diagnosis not present

## 2021-11-11 DIAGNOSIS — Z79899 Other long term (current) drug therapy: Secondary | ICD-10-CM | POA: Diagnosis not present

## 2021-11-11 DIAGNOSIS — E559 Vitamin D deficiency, unspecified: Secondary | ICD-10-CM | POA: Diagnosis not present

## 2021-11-11 DIAGNOSIS — R209 Unspecified disturbances of skin sensation: Secondary | ICD-10-CM | POA: Diagnosis not present

## 2021-11-11 DIAGNOSIS — Z23 Encounter for immunization: Secondary | ICD-10-CM | POA: Diagnosis not present

## 2022-01-13 ENCOUNTER — Other Ambulatory Visit: Payer: Self-pay | Admitting: Internal Medicine

## 2022-01-13 DIAGNOSIS — M8589 Other specified disorders of bone density and structure, multiple sites: Secondary | ICD-10-CM

## 2022-10-26 ENCOUNTER — Ambulatory Visit (HOSPITAL_COMMUNITY)
Admission: EM | Admit: 2022-10-26 | Discharge: 2022-10-26 | Disposition: A | Discharge status: Home / Self Care | Payer: Medicare Other | Attending: Internal Medicine | Admitting: Internal Medicine

## 2022-10-26 ENCOUNTER — Encounter (HOSPITAL_COMMUNITY): Payer: Self-pay

## 2022-10-26 DIAGNOSIS — H6122 Impacted cerumen, left ear: Secondary | ICD-10-CM

## 2022-10-26 DIAGNOSIS — B029 Zoster without complications: Secondary | ICD-10-CM | POA: Diagnosis not present

## 2022-10-26 MED ORDER — VALACYCLOVIR HCL 1 G PO TABS: 1000.0000 mg | ORAL_TABLET | Freq: Three times a day (TID) | ORAL | 0 refills | Status: AC

## 2022-10-26 NOTE — ED Triage Notes (Signed)
Patient reports that she began hving blisters and itching n the left side of her neck 3 days ago. Patient states she did get a shingles vaccine 5-6 months ago.

## 2022-10-26 NOTE — ED Provider Notes (Signed)
[facility]    CSN: [id number] Arrival date & time: 10/26/22  1007      History   Chief Complaint No chief complaint on file.   HPI Sandra [name] is a 82 y.o. female.   HPI Blistering rash left side of face neck and upper chest wall onset 3 days ago admits discomfort.  Admits headache.  Denies fever, chills, sweats, change in vision, dizziness or lightheadedness, nausea, vomiting.  Denies history of similar rash in the past.  Had shingles vaccine 5 to 6 months ago.  Denies known contacts with illness.  Denies change in hearing, ear pain  Past Medical History:  Diagnosis Date   Cataract of both eyes    extraction surgery scheduled for 04/ 2017   Eczema    Frequency of urination    H/O radioactive iodine thyroid ablation    2012   History of colon polyps    History of frequent urinary tract infections    History of hyperthyroidism    dx 2012-- monitored by dr Casimiro Needle altheimer   Hydronephrosis, right    Wears dentures    UPPER    Patient Active Problem List   Diagnosis Date Noted   Cystocele 07/03/2015    Past Surgical History:  Procedure Laterality Date   ANTERIOR AND POSTERIOR REPAIR N/A 07/03/2015   Procedure: REPAIR CYSTOCELE   ;  Surgeon: Alfredo Martinez, MD;  Location: [facility];  Service: Urology;  Laterality: N/A;   COLONOSCOPY WITH PROPOFOL N/A 08/10/2012   Procedure: COLONOSCOPY WITH PROPOFOL;  Surgeon: Charolett Bumpers, MD;  Location: [facility];  Service: Endoscopy;  Laterality: N/A;   CYSTOSCOPY N/A 07/03/2015   Procedure: CYSTOSCOPY;  Surgeon: Alfredo Martinez, MD;  Location: [facility];  Service: Urology;  Laterality: N/A;   CYSTOSCOPY WITH RETROGRADE PYELOGRAM, URETEROSCOPY AND STENT PLACEMENT Right 02/19/2015   Procedure: CYSTOSCOPY WITH RETROGRADE PYELOGRAM, URETEROSCOPY;  Surgeon: Malen Gauze, MD;  Location[facility];  Service: Urology;  Laterality: Right;   DILATION AND CURETTAGE OF UTERUS     HEMORRHOID SURGERY   1950's   SALPINGOOPHORECTOMY Bilateral 07/03/2015   Procedure: SALPINGO OOPHORECTOMY;  Surgeon: Maxie Better, MD;  Location: [facility];  Service: Gynecology;  Laterality: Bilateral;   TUBAL LIGATION     VAGINAL HYSTERECTOMY N/A 07/03/2015   Procedure: HYSTERECTOMY VAGINAL;  Surgeon: Maxie Better, MD;  Location: [facility];  Service: Gynecology;  Laterality: N/A;   VAGINAL PROLAPSE REPAIR N/A 07/03/2015   Procedure: VAGINAL VAULT PROLAPSE WITH GRAFT ;  Surgeon: Alfredo Martinez, MD;  Location: [facility];  Service: Urology;  Laterality: N/A;    OB History   No obstetric history on file.      Home Medications    Prior to Admission medications   Medication Sig Start Date End Date Taking? Authorizing Provider  valACYclovir (VALTREX) 1000 MG tablet Take 1 tablet (1,000 mg total) by mouth 3 (three) times daily for 7 days. 10/26/22 11/02/22 Yes Meliton Rattan, PA  acetaminophen (TYLENOL) 325 MG tablet Take 650 mg by mouth every 6 (six) hours as needed for moderate pain or headache.    [provider]  Calcium Carbonate-Vitamin D (CALTRATE 600+D PO) Take 1 tablet by mouth 2 (two) times daily.    [provider]  Cholecalciferol (VITAMIN D) 2000 UNITS tablet Take 2,000 Units by mouth daily.    [provider]  hydrocortisone cream 1 % Apply 1 application topically 2 (two) times daily as needed for itching.  [provider]  traMADol (ULTRAM) 50 MG tablet Take 1 tablet (50 mg total) by mouth every 6 (six) hours as needed. Patient not taking: Reported on 06/25/2015 02/19/15   Malen Gauze, MD    Family History Family History  Problem Relation Age of Onset   Breast cancer Mother     Social History Social History   Tobacco Use   Smoking status: Never   Smokeless tobacco: Never  Vaping Use   Vaping status: Never Used  Substance Use Topics   Alcohol use: No   Drug use: No     Allergies   Aspirin, Aleve [naproxen sodium], Ibuprofen, and  Other   Review of Systems Review of Systems   Physical Exam Triage Vital Signs ED Triage Vitals  Encounter Vitals Group     BP 10/26/22 1036 (!) 148/76     Systolic BP Percentile --      Diastolic BP Percentile --      Pulse Rate 10/26/22 1036 90     Resp 10/26/22 1036 16     Temp 10/26/22 1036 98.1 F (36.7 C)     Temp Source 10/26/22 1036 Oral     SpO2 10/26/22 1036 97 %     Weight --      Height --      Head Circumference --      Peak Flow --      Pain Score 10/26/22 1039 0     Pain Loc --      Pain Education --      Exclude from Growth Chart --    No data found.  Updated Vital Signs BP (!) 148/76 (BP Location: Right Arm)   Pulse 90   Temp 98.1 F (36.7 C) (Oral)   Resp 16   SpO2 97%   Visual Acuity Right Eye Distance:   Left Eye Distance:   Bilateral Distance:    Right Eye Near:   Left Eye Near:    Bilateral Near:     Physical Exam Constitutional:      Appearance: She is not ill-appearing.  HENT:     Right Ear: Tympanic membrane normal.     Left Ear: There is impacted cerumen.     Nose: No rhinorrhea.  Eyes:     Extraocular Movements: Extraocular movements intact.     Conjunctiva/sclera: Conjunctivae normal.  Cardiovascular:     Rate and Rhythm: Normal rate.  Pulmonary:     Effort: Pulmonary effort is normal. No respiratory distress.  Musculoskeletal:     Cervical back: Neck supple.  Skin:    Findings: Rash (Vesicular rash affecting lower left cheek left neck left upper chest left pinna) present.  Neurological:     Mental Status: She is alert and oriented to person, place, and time.     Cranial Nerves: No cranial nerve deficit.      UC Treatments / Results  Labs (all labs ordered are listed, but only abnormal results are displayed) Labs Reviewed - No data to display  EKG   Radiology No results found.  Procedures Ear Cerumen Removal  Date/Time: 10/26/2022 11:07 AM  Performed by: Meliton Rattan, PA Authorized by: Meliton Rattan, PA   Consent:    Consent obtained:  Verbal   Consent given by:  Patient Universal protocol:    Patient identity confirmed:  Verbally with patient Procedure details:    Location:  L ear   Procedure type: irrigation     Procedure outcomes: cerumen removed  Post-procedure details:    Inspection:  No bleeding, TM intact and ear canal clear   Procedure completion:  Tolerated well, no immediate complications  (including critical care time)  Medications Ordered in UC Medications - No data to display  Initial Impression / Assessment and Plan / UC Course  I have reviewed the triage vital signs and the nursing notes.  Pertinent labs & imaging results that were available during my care of the patient were reviewed by me and considered in my medical decision making (see chart for details).     82 year old female presents with rash consistent with herpes zoster onset 3 days ago.  Admits headache denies fever, dizziness, ear pain, change in hearing.  She was noted to have a left cerumen impaction which was cleared by me with irrigation and curette, postprocedure examination revealed well canal, normal TM no lesions in the ear or along the TM  Final Clinical Impressions(s) / UC Diagnoses   Final diagnoses:  Herpes zoster without complication   Discharge Instructions   None    ED Prescriptions     Medication Sig Dispense Auth. Provider   valACYclovir (VALTREX) 1000 MG tablet Take 1 tablet (1,000 mg total) by mouth 3 (three) times daily for 7 days. 21 tablet Meliton Rattan, [place]      PDMP not reviewed this encounter.   Meliton Rattan, [place] 10/26/22 1108

## 2022-11-03 DIAGNOSIS — B029 Zoster without complications: Secondary | ICD-10-CM | POA: Diagnosis not present

## 2023-01-09 IMAGING — DX DG KNEE COMPLETE 4+V*R*
5 series · 5 of 5 positions shown · non-contrast
Comparison: 09/15/2016

CLINICAL DATA: Chronic right knee pain

EXAM:
RIGHT KNEE - COMPLETE 4+ VIEW

[dg knee complete 4 views right (1 of 5)]
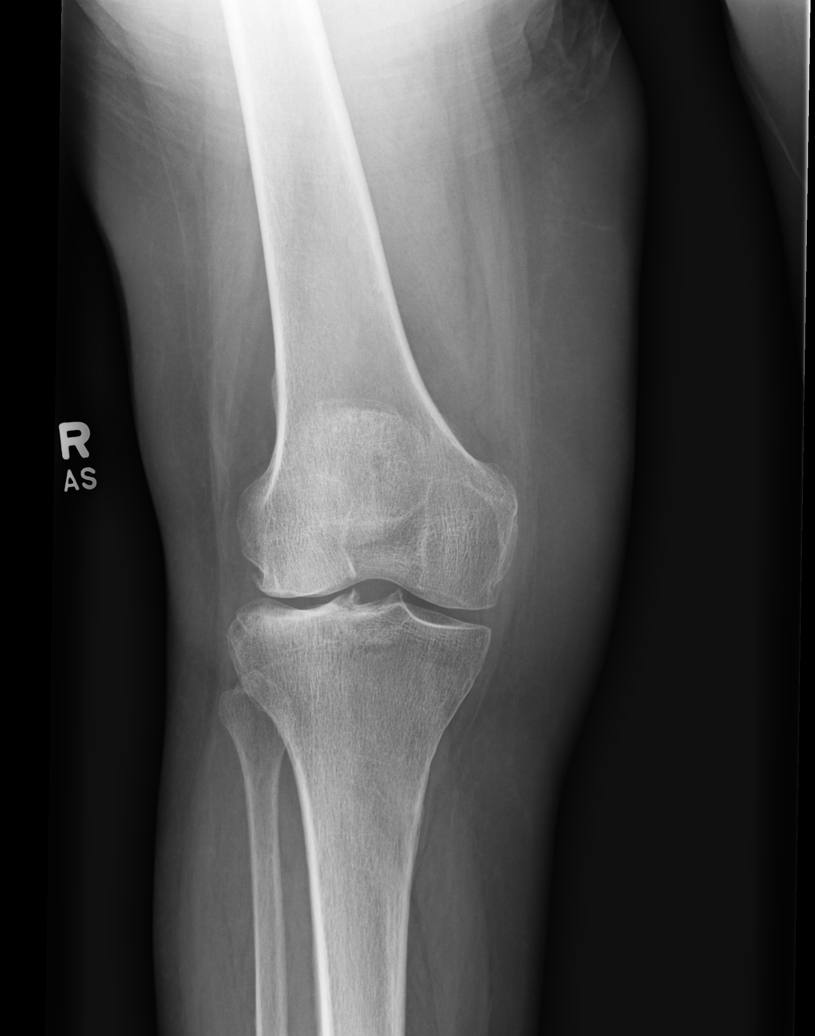

[dg knee complete 4 views right (2 of 5)]
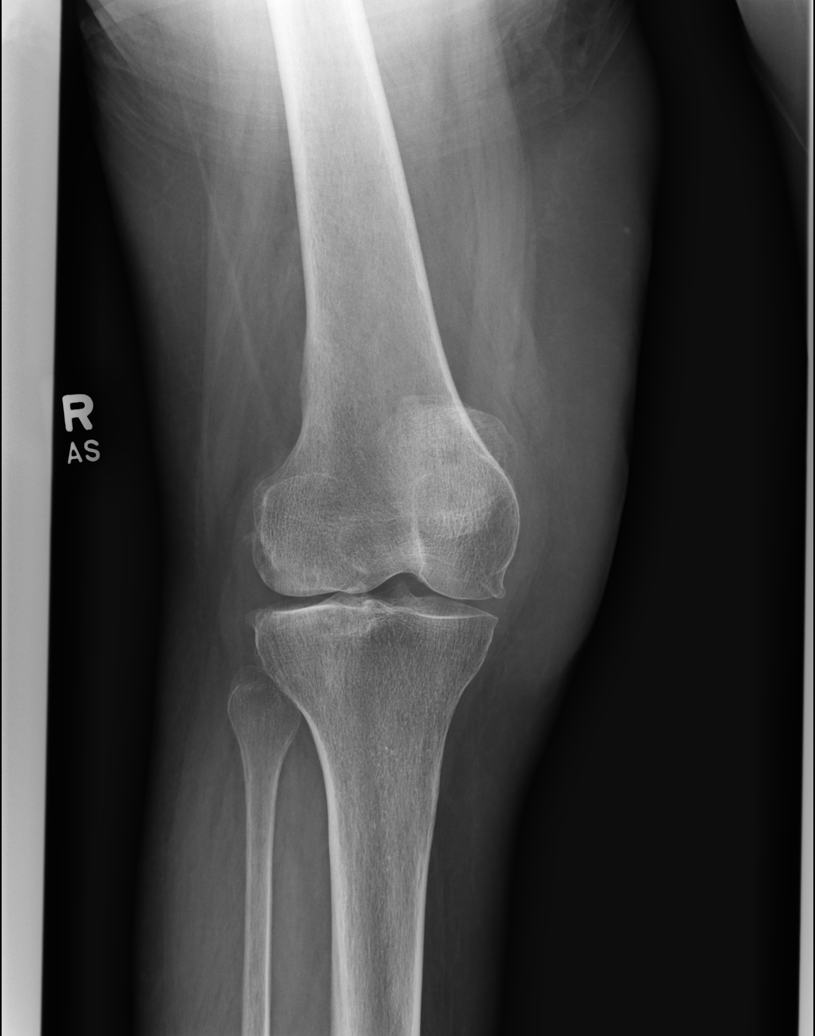

[dg knee complete 4 views right (3 of 5)]
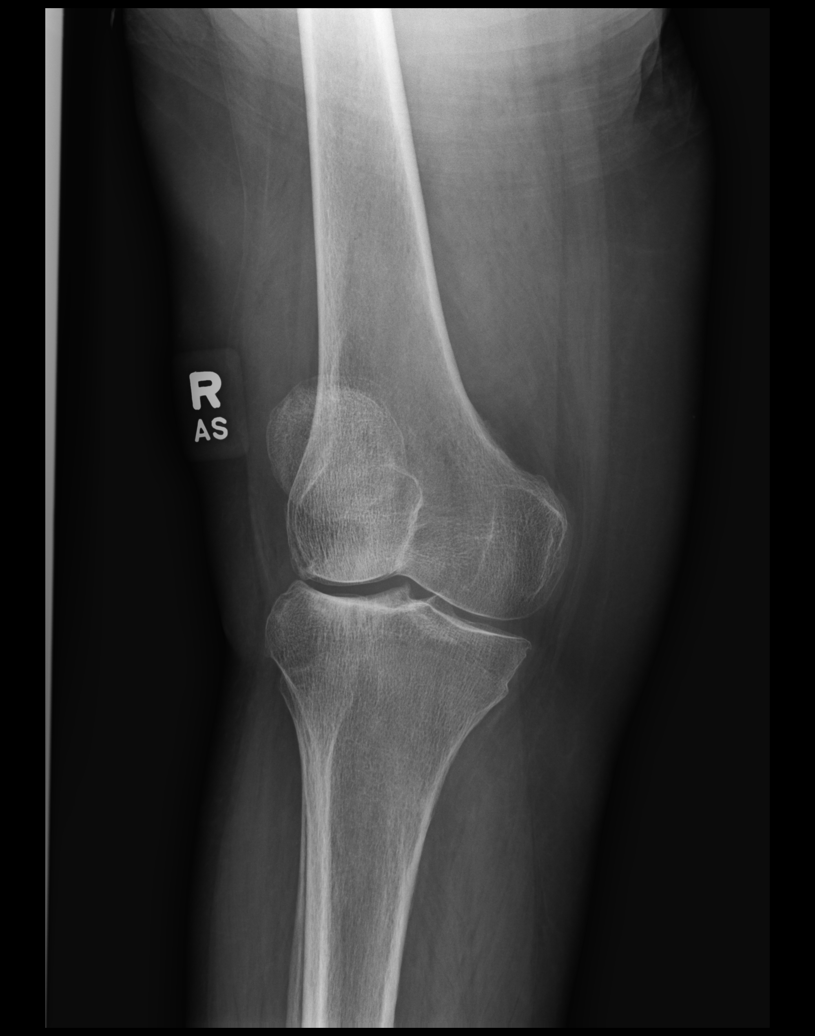

[dg knee complete 4 views right (4 of 5)]
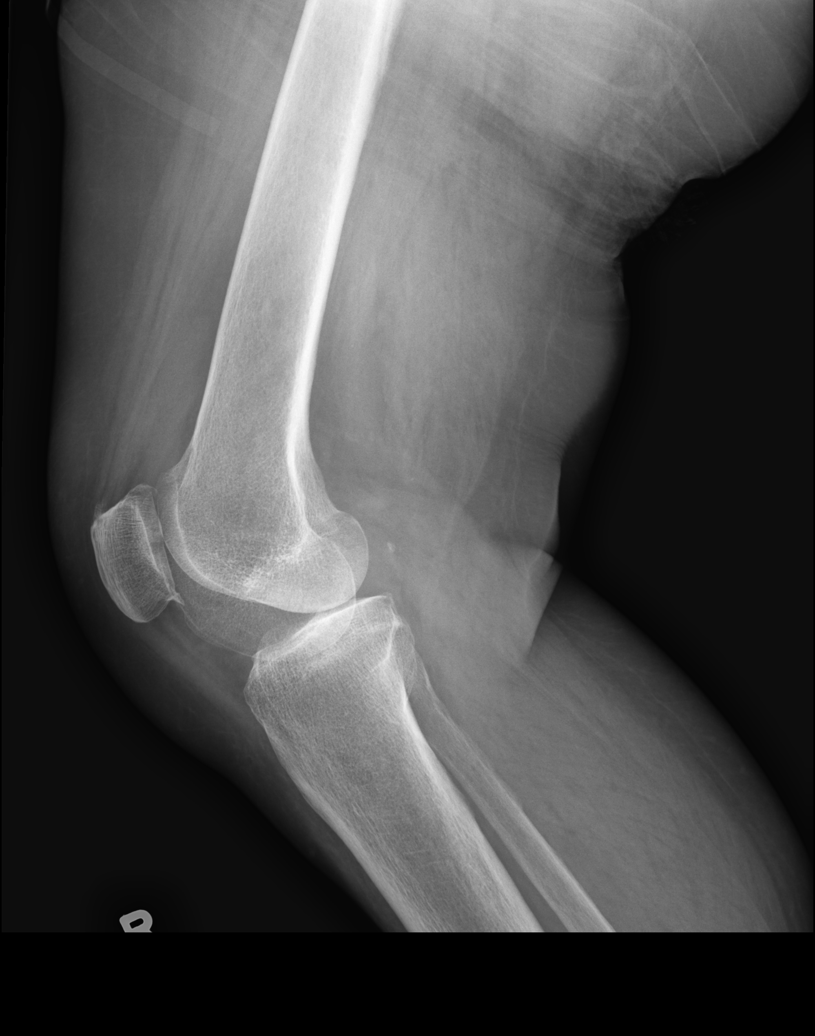

[dg knee complete 4 views right (5 of 5)]
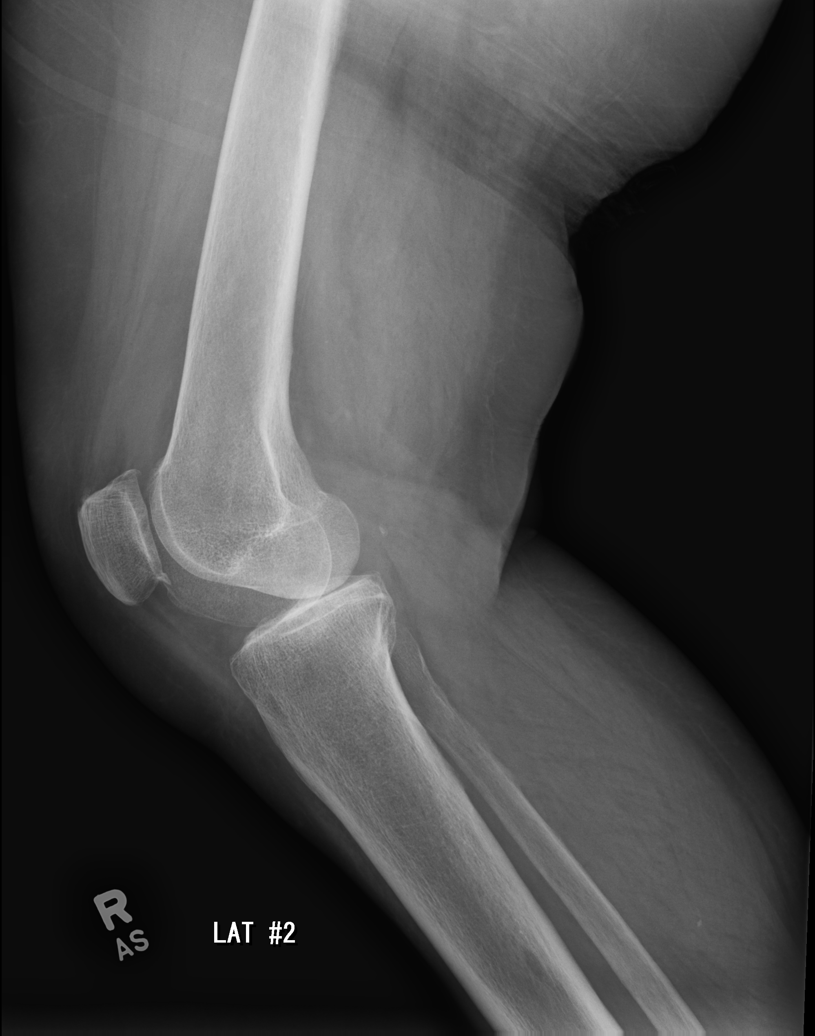

[5 of 5 positions shown; findings below may reference images not displayed]

FINDINGS: Tricompartmental degenerative changes are noted. No acute fracture
or dislocation is noted. Small joint effusion is seen. No other
focal abnormality is noted.
IMPRESSION: Tricompartmental degenerative changes with small joint effusion.
This has worsened in the interval from the prior exam.

## 2023-06-29 ENCOUNTER — Other Ambulatory Visit (HOSPITAL_BASED_OUTPATIENT_CLINIC_OR_DEPARTMENT_OTHER): Payer: Self-pay | Admitting: Internal Medicine

## 2023-06-29 DIAGNOSIS — R946 Abnormal results of thyroid function studies: Secondary | ICD-10-CM | POA: Diagnosis not present

## 2023-06-29 DIAGNOSIS — E559 Vitamin D deficiency, unspecified: Secondary | ICD-10-CM | POA: Diagnosis not present

## 2023-06-29 DIAGNOSIS — M8589 Other specified disorders of bone density and structure, multiple sites: Secondary | ICD-10-CM

## 2023-06-29 DIAGNOSIS — Z0000 Encounter for general adult medical examination without abnormal findings: Secondary | ICD-10-CM | POA: Diagnosis not present

## 2023-06-29 DIAGNOSIS — E7800 Pure hypercholesterolemia, unspecified: Secondary | ICD-10-CM | POA: Diagnosis not present

## 2023-06-29 DIAGNOSIS — I10 Essential (primary) hypertension: Secondary | ICD-10-CM | POA: Diagnosis not present

## 2023-06-29 DIAGNOSIS — Z79899 Other long term (current) drug therapy: Secondary | ICD-10-CM | POA: Diagnosis not present

## 2023-06-29 DIAGNOSIS — M25552 Pain in left hip: Secondary | ICD-10-CM | POA: Diagnosis not present

## 2023-06-29 DIAGNOSIS — E042 Nontoxic multinodular goiter: Secondary | ICD-10-CM | POA: Diagnosis not present

## 2023-06-29 DIAGNOSIS — R7303 Prediabetes: Secondary | ICD-10-CM | POA: Diagnosis not present
